# Patient Record
Sex: Female | Born: 1995 | Race: Black or African American | Hispanic: No | Marital: Single | Smoking: Never smoker
Health system: Southern US, Community
[De-identification: ages and names within clinical notes are randomized; demographics above are authoritative.]

## PROBLEM LIST (undated history)

## (undated) DIAGNOSIS — R519 Headache, unspecified: Secondary | ICD-10-CM

## (undated) DIAGNOSIS — F419 Anxiety disorder, unspecified: Secondary | ICD-10-CM

## (undated) DIAGNOSIS — D649 Anemia, unspecified: Secondary | ICD-10-CM

## (undated) DIAGNOSIS — G935 Compression of brain: Secondary | ICD-10-CM

## (undated) HISTORY — PX: BRAIN SURGERY: SHX531

## (undated) HISTORY — PX: OTHER SURGICAL HISTORY: SHX169

---
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
## 2014-08-24 ENCOUNTER — Emergency Department (HOSPITAL_COMMUNITY)
Admission: EM | Admit: 2014-08-24 | Discharge: 2014-08-24 | Disposition: A | Discharge status: Home / Self Care | Payer: BLUE CROSS/BLUE SHIELD | Source: Home / Self Care | Attending: Family Medicine | Admitting: Family Medicine

## 2014-08-24 ENCOUNTER — Encounter (HOSPITAL_COMMUNITY): Payer: Self-pay | Admitting: Emergency Medicine

## 2014-08-24 DIAGNOSIS — R21 Rash and other nonspecific skin eruption: Secondary | ICD-10-CM

## 2014-08-24 LAB — MONONUCLEOSIS SCREEN: MONO SCREEN: NEGATIVE

## 2014-08-24 MED ORDER — PREDNISONE 5 MG (21) PO TBPK: 5.0000 mg | ORAL_TABLET | Freq: Every day | ORAL | Status: DC

## 2014-08-24 NOTE — ED Provider Notes (Signed)
Shelly Leblanc is a 19 y.o. female who presents to Urgent Care today for rash. Patient notes a significant rash on trunk and extremities. This has been present for 2 weeks and is worsening. No new medications, food, cosmetics, detergents etc. She was seen by PCP who gave hydrocortisone cream which did not help. She notes that before the rash started she had some sore throat and was diagnosed with tonsillitis. She feels well otherwise. The rash is not itchy. She denies any fevers chills bodyaches nausea vomiting or diarrhea. No mouth involvement.   History reviewed. No pertinent past medical history. History reviewed. No pertinent past surgical history. History  Substance Use Topics  . Smoking status: Never Smoker   . Smokeless tobacco: Never Used  . Alcohol Use: No   ROS as above Medications: No current facility-administered medications for this encounter.   Current Outpatient Prescriptions  Medication Sig Dispense Refill  . acetaminophen (TYLENOL) 325 MG tablet Take 650 mg by mouth every 6 (six) hours as needed for moderate pain.    . predniSONE (STERAPRED UNI-PAK 21 TAB) 5 MG (21) TBPK tablet Take 1 tablet (5 mg total) by mouth daily. 6 day prednisone dosepack po 21 tablet 0   No Known Allergies   Exam:  BP 138/84 mmHg  Pulse 86  Temp(Src) 98 F (36.7 C) (Oral)  SpO2 99%  LMP 08/03/2014 (Approximate) Gen: Well NAD nontoxic HEENT: EOMI,  MMM no oral lesions Lungs: Normal work of breathing. CTABL Heart: RRR no MRG Abd: NABS, Soft. Nondistended, Nontender Exts: Brisk capillary refill, warm and well perfused.  Skin: Multiple scattered flesh-colored papules on trunk and extremities. 2 numerous to count.  No results found for this or any previous visit (from the past 24 hour(s)). No results found.  Assessment and Plan: 19 y.o. female with rash. Unclear etiology. Likely viral exanthem. Possibly due to EBV. EBV titer pending. Treatment with 6 day Dosepak of prednisone. Referral  to dermatology if not better.  Discussed warning signs or symptoms. Please see discharge instructions. Patient expresses understanding.     Rodolph Bong, MD 08/24/14 [phone]

## 2014-08-24 NOTE — Discharge Instructions (Signed)
Thank you for coming in today. Call or go to the emergency room if you get worse, have trouble breathing, have chest pains, or palpitations.  Follow up with Dr. Terri Piedra if not better.  Take prednisone.  Return as needed.   Viral Exanthems, Adult Many viral infections of the skin are called viral exanthems. Exanthem is another name for a rash or skin eruption. The most common viral exanthems include the following:  [place] measles or rubella.  Measles or rubeola.  Roseola.  Parvovirus B19 (Erythema infectiosum or Fifth disease).  Chickenpox or varicella. DIAGNOSIS  Sometimes, other problems may cause a rash that looks like a viral exanthem. Most often, your caregiver can determine whether you have a viral exanthem by looking at the rash. They usually have distinct patterns or appearance. Lab work may be done if the diagnosis is uncertain. Sometimes, a small tissue sample (biopsy) of the rash may need to be taken. TREATMENT  Immunizations have led to a decrease in the number of cases of measles, mumps, and rubella. Viral exanthems may require clinical treatment if a bacterial infection or other problems follow. The rash may be associated with:  Minor sore throat.  Aches and pains.  Runny nose.  Watery eyes.  Tiredness.  Some coughs.  Gastrointestinal infections causing nausea, vomiting, and diarrhea. Viral exanthems do not respond to antibiotic medicines, because they are not caused by bacteria. HOME CARE INSTRUCTIONS   Only take over-the-counter or prescription medicines for pain, discomfort, diarrhea, or fever as directed by your caregiver.  Drink enough water and fluids to keep your urine clear or pale yellow. SEEK MEDICAL CARE IF:  You develop swollen neck glands. This may feel like lumps or bumps in the neck.  You develop tenderness over your sinuses.  You are not feeling partly better after 3 days.  You develop muscle aches.  You are feeling more tired than you  would expect.  You get a persistent cough with mucus. SEEK IMMEDIATE MEDICAL CARE IF:   You have a fever.  You develop red eyes or eye pain.  You develop sores in your mouth and difficulty drinking or eating.  You develop a sore throat with pus and difficulty swallowing.  You develop neck pain or a stiff neck.  You develop a severe headache.  You develop vomiting that will not stop. Document Released: 05/11/2002 Document Revised: 05/13/2011 Document Reviewed: 05/08/2010 [facility] Patient Information 2015 [place], [place]. This information is not intended to replace advice given to you by your health care provider. Make sure you discuss any questions you have with your health care provider.

## 2014-08-24 NOTE — ED Notes (Signed)
Pt started with a rash on her abdomen about two weeks ago.  It has since spread to her entire body.  She states it does not itch and is not painful, but does feel like it is "crawling" at night.  She denies any fever or any other problems.

## 2014-08-30 ENCOUNTER — Encounter (HOSPITAL_COMMUNITY): Payer: Self-pay | Admitting: Emergency Medicine

## 2014-08-30 ENCOUNTER — Emergency Department (HOSPITAL_COMMUNITY)
Admission: EM | Admit: 2014-08-30 | Discharge: 2014-08-30 | Disposition: A | Discharge status: Home / Self Care | Payer: BLUE CROSS/BLUE SHIELD | Source: Home / Self Care | Attending: Family Medicine | Admitting: Family Medicine

## 2014-08-30 DIAGNOSIS — R21 Rash and other nonspecific skin eruption: Secondary | ICD-10-CM

## 2014-08-30 MED ORDER — PREDNISONE 10 MG (48) PO TBPK: ORAL_TABLET | Freq: Every day | ORAL | Status: DC

## 2014-08-30 MED ORDER — TRIAMCINOLONE ACETONIDE 0.1 % EX CREA: 1.0000 "application " | TOPICAL_CREAM | Freq: Two times a day (BID) | CUTANEOUS | Status: DC

## 2014-08-30 NOTE — ED Provider Notes (Signed)
Shelly Leblanc is a 19 y.o. female who presents to Urgent Care today for rash. Patient was seen on the 22nd for a rash that was unclear etiology. She was treated with 65 mg prednisone Dosepak to help temporarily. The rash returned. She denies itching but notes some burning sensation. No fevers chills nausea vomiting or diarrhea. She feels well otherwise. She has an appointment scheduled with a dermatologist next week.   History reviewed. No pertinent past medical history. History reviewed. No pertinent past surgical history. History  Substance Use Topics  . Smoking status: Never Smoker   . Smokeless tobacco: Never Used  . Alcohol Use: No   ROS as above Medications: No current facility-administered medications for this encounter.   Current Outpatient Prescriptions  Medication Sig Dispense Refill  . hydrocortisone cream 1 % Apply 1 application topically 2 (two) times daily.    Marland Kitchen. acetaminophen (TYLENOL) 325 MG tablet Take 650 mg by mouth every 6 (six) hours as needed for moderate pain.    . predniSONE (STERAPRED UNI-PAK 48 TAB) 10 MG (48) TBPK tablet Take by mouth daily. 48 tablet 0  . triamcinolone cream (KENALOG) 0.1 % Apply 1 application topically 2 (two) times daily. 453.6 g 0   No Known Allergies   Exam:  BP 114/77 mmHg  Pulse 83  Temp(Src) 98.5 F (36.9 C) (Oral)  Resp 12  SpO2 100%  LMP 08/03/2014 (Approximate) Gen: Well NAD HEENT: EOMI,  MMM no oral lesions Lungs: Normal work of breathing. CTABL Heart: RRR no MRG Abd: NABS, Soft. Nondistended, Nontender Exts: Brisk capillary refill, warm and well perfused.  Skin: Maculopapular erythematous rash on trunk and extremities.  No results found for this or any previous visit (from the past 24 hour(s)). No results found.  Assessment and Plan: 19 y.o. female with rash. Unclear etiology possibly pityriasis rosea although rash is not completely characteristic of pityriasis. Treatment with longer prednisone Dosepak triamcinolone  and follow-up with dermatology.  Discussed warning signs or symptoms. Please see discharge instructions. Patient expresses understanding.     Rodolph BongEvan S Mahika Vanvoorhis, MD 08/30/14 [phone]

## 2014-08-30 NOTE — ED Notes (Signed)
Rash x 4 weeks

## 2014-08-30 NOTE — Discharge Instructions (Signed)
Thank you for coming in today. I am not sure what the rash is.  Try taking a longer, stronger prednisone dose.  Use the cream twice daily as needed. Follow up with Dr. Terri PiedraLupton.   Pityriasis Rosea Pityriasis rosea is a rash which is probably caused by a virus. It generally starts as a scaly, red patch on the trunk (the area of the body that a t-shirt would cover) but does not appear on sun exposed areas. The rash is usually preceded by an initial larger spot called the "herald patch" a week or more before the rest of the rash appears. Generally within one to two days the rash appears rapidly on the trunk, upper arms, and sometimes the upper legs. The rash usually appears as flat, oval patches of scaly pink color. The rash can also be raised and one is able to feel it with a finger. The rash can also be finely crinkled and may slough off leaving a ring of scale around the spot. Sometimes a mild sore throat is present with the rash. It usually affects children and young adults in the spring and autumn. Women are more frequently affected than men. TREATMENT  Pityriasis rosea is a self-limited condition. This means it goes away within 4 to 8 weeks without treatment. The spots may persist for several months, especially in darker-colored skin after the rash has resolved and healed. Benadryl and steroid creams may be used if itching is a problem. SEEK MEDICAL CARE IF:   Your rash does not go away or persists longer than three months.  You develop fever and joint pain.  You develop severe headache and confusion.  You develop breathing difficulty, vomiting and/or extreme weakness. Document Released: 03/27/2001 Document Revised: 05/13/2011 Document Reviewed: 04/15/2008 [facility] Patient Information 2015 [employer]. This information is not intended to replace advice given to you by your health care provider. Make sure you discuss any questions you have with your health care provider.

## 2016-09-04 ENCOUNTER — Emergency Department (HOSPITAL_COMMUNITY)
Admission: EM | Admit: 2016-09-04 | Discharge: 2016-09-05 | Disposition: A | Discharge status: Home / Self Care | Payer: 59 | Attending: Emergency Medicine | Admitting: Emergency Medicine

## 2016-09-04 ENCOUNTER — Encounter (HOSPITAL_COMMUNITY): Payer: Self-pay | Admitting: Emergency Medicine

## 2016-09-04 DIAGNOSIS — Y929 Unspecified place or not applicable: Secondary | ICD-10-CM | POA: Diagnosis not present

## 2016-09-04 DIAGNOSIS — S0083XA Contusion of other part of head, initial encounter: Secondary | ICD-10-CM | POA: Diagnosis not present

## 2016-09-04 DIAGNOSIS — Y999 Unspecified external cause status: Secondary | ICD-10-CM | POA: Diagnosis not present

## 2016-09-04 DIAGNOSIS — Y9389 Activity, other specified: Secondary | ICD-10-CM | POA: Diagnosis not present

## 2016-09-04 DIAGNOSIS — H1132 Conjunctival hemorrhage, left eye: Secondary | ICD-10-CM | POA: Diagnosis not present

## 2016-09-04 DIAGNOSIS — Z79899 Other long term (current) drug therapy: Secondary | ICD-10-CM | POA: Diagnosis not present

## 2016-09-04 DIAGNOSIS — H5712 Ocular pain, left eye: Secondary | ICD-10-CM | POA: Diagnosis present

## 2016-09-04 DIAGNOSIS — T148XXA Other injury of unspecified body region, initial encounter: Secondary | ICD-10-CM

## 2016-09-04 MED ORDER — TETRACAINE HCL 0.5 % OP SOLN
2.0000 [drp] | Freq: Once | OPHTHALMIC | Status: AC
Start: 2016-09-04 — End: 2016-09-04
  Administered 2016-09-04: 2 [drp] via OPHTHALMIC
  Filled 2016-09-04: qty 4

## 2016-09-04 MED ORDER — FLUORESCEIN SODIUM 0.6 MG OP STRP
1.0000 | ORAL_STRIP | Freq: Once | OPHTHALMIC | Status: AC
Start: 2016-09-04 — End: 2016-09-04
  Administered 2016-09-04: 1 via OPHTHALMIC
  Filled 2016-09-04: qty 1
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
## 2016-09-05 ENCOUNTER — Emergency Department (HOSPITAL_COMMUNITY): Payer: 59

## 2016-09-05 MED ORDER — IBUPROFEN 800 MG PO TABS: 800.0000 mg | ORAL_TABLET | Freq: Three times a day (TID) | ORAL | 0 refills | Status: DC

## 2016-09-05 NOTE — ED Notes (Signed)
Patient transported to CT 

## 2017-09-16 ENCOUNTER — Encounter (HOSPITAL_COMMUNITY): Payer: Self-pay

## 2017-09-16 ENCOUNTER — Emergency Department (HOSPITAL_COMMUNITY)
Admission: EM | Admit: 2017-09-16 | Discharge: 2017-09-16 | Disposition: A | Discharge status: Home / Self Care | Payer: 59 | Attending: Emergency Medicine | Admitting: Emergency Medicine

## 2017-09-16 DIAGNOSIS — J36 Peritonsillar abscess: Secondary | ICD-10-CM | POA: Diagnosis not present

## 2017-09-16 DIAGNOSIS — J029 Acute pharyngitis, unspecified: Secondary | ICD-10-CM | POA: Diagnosis present

## 2017-09-16 MED ORDER — PREDNISONE 10 MG PO TABS: 20.0000 mg | ORAL_TABLET | Freq: Two times a day (BID) | ORAL | 0 refills | Status: DC

## 2017-09-16 MED ORDER — DEXAMETHASONE SODIUM PHOSPHATE 10 MG/ML IJ SOLN
10.0000 mg | Freq: Once | INTRAMUSCULAR | Status: AC
  Administered 2017-09-16: 10 mg via INTRAVENOUS
  Filled 2017-09-16: qty 1

## 2017-09-16 MED ORDER — CLINDAMYCIN HCL 300 MG PO CAPS: 300.0000 mg | ORAL_CAPSULE | Freq: Four times a day (QID) | ORAL | 0 refills | Status: DC

## 2017-09-16 MED ORDER — CLINDAMYCIN PHOSPHATE 600 MG/50ML IV SOLN
600.0000 mg | Freq: Once | INTRAVENOUS | Status: AC
  Administered 2017-09-16: 600 mg via INTRAVENOUS
  Filled 2017-09-16: qty 50

## 2017-09-16 NOTE — Discharge Instructions (Addendum)
Clindamycin and prednisone as prescribed.  Follow-up with ENT in the next 1 to 2 days.  The contact information for Dr. Jearld FentonByers has been provided in this discharge summary for you to call and make these arrangements.

## 2017-09-16 NOTE — ED Triage Notes (Signed)
Pt complains of a sore throat, she was seen at her primary doctor today and diagnosed with tonsillitis, she received antibiotics and now states that she's having trouble breathing

## 2017-09-16 NOTE — ED Provider Notes (Signed)
[facility]-EMERGENCY DEPT Provider Note   CSN: [id number] Arrival date & time: 09/16/17  0007     History   Chief Complaint Chief Complaint  Patient presents with  . Sore Throat    HPI Kyle L Melven SartoriusCaesar is a 22 y.o. female.  Patient is a 22 year old female presenting with sore throat.  She was seen by her primary doctor for this and diagnosed with tonsillitis.  She comes in tonight with complaints of worsening pain and swelling and difficulty breathing and swallowing.  The history is provided by the patient.  Sore Throat  This is a new problem. The current episode started 2 days ago. The problem occurs constantly. The problem has been rapidly worsening. The symptoms are aggravated by swallowing. Nothing relieves the symptoms. Treatments tried: Amoxicillin. The treatment provided no relief.    History reviewed. No pertinent past medical history.  There are no active problems to display for this patient.   History reviewed. No pertinent surgical history.   OB History   None      Home Medications    Prior to Admission medications   Medication Sig Start Date End Date Taking? Authorizing Provider  penicillin v potassium (VEETID) 500 MG tablet Take 500 mg by mouth 2 (two) times daily.   Yes [provider]  ibuprofen (ADVIL,MOTRIN) 800 MG tablet Take 1 tablet (800 mg total) by mouth 3 (three) times daily. Patient not taking: Reported on 09/16/2017 09/05/16   Gilda CreasePollina, Christopher J, MD  predniSONE (STERAPRED UNI-PAK 48 TAB) 10 MG (48) TBPK tablet Take by mouth daily. Patient not taking: Reported on 09/16/2017 08/30/14   Rodolph Bongorey, Evan S, MD  triamcinolone cream (KENALOG) 0.1 % Apply 1 application topically 2 (two) times daily. Patient not taking: Reported on 09/16/2017 08/30/14   Rodolph Bongorey, Evan S, MD    Family History History reviewed. No pertinent family history.  Social History Social History   Tobacco Use  . Smoking status: Never Smoker  .  Smokeless tobacco: Never Used  Substance Use Topics  . Alcohol use: Yes    Comment: socially   . Drug use: No     Allergies   Patient has no known allergies.   Review of Systems Review of Systems  All other systems reviewed and are negative.    Physical Exam Updated Vital Signs BP 140/82 (BP Location: Left Arm)   Pulse 96   Temp 99.7 F (37.6 C) (Oral)   Resp 18   SpO2 100%   Physical Exam  Constitutional: She is oriented to person, place, and time. She appears well-developed and well-nourished. No distress.  HENT:  Head: Normocephalic and atraumatic.  Mouth/Throat: No uvula swelling. Oropharyngeal exudate and tonsillar abscesses present.  There are exudates to the right tonsil.  There is right tonsillar enlargement with slight deviation of the tonsillar pillar.  Neck: Normal range of motion. Neck supple.  Cardiovascular: Normal rate and regular rhythm. Exam reveals no gallop and no friction rub.  No murmur heard. Pulmonary/Chest: Effort normal and breath sounds normal. No respiratory distress. She has no wheezes.  Abdominal: Soft. Bowel sounds are normal. She exhibits no distension. There is no tenderness.  Musculoskeletal: Normal range of motion.  Neurological: She is alert and oriented to person, place, and time.  Skin: Skin is warm and dry. She is not diaphoretic.  Nursing note and vitals reviewed.    ED Treatments / Results  Labs (all labs ordered are listed, but only abnormal results are displayed) Labs Reviewed -  No data to display  EKG None  Radiology No results found.  Procedures Procedures (including critical care time)  Medications Ordered in ED Medications  clindamycin (CLEOCIN) IVPB 600 mg (600 mg Intravenous New Bag/Given 09/16/17 0421)  dexamethasone (DECADRON) injection 10 mg (10 mg Intravenous Given 09/16/17 0420)     Initial Impression / Assessment and Plan / ED Course  I have reviewed the triage vital signs and the nursing  notes.  Pertinent labs & imaging results that were available during my care of the patient were reviewed by me and considered in my medical decision making (see chart for details).  Patient given Decadron and clindamycin.  She will be discharged with prednisone and clindamycin and follow-up with ENT.  Final Clinical Impressions(s) / ED Diagnoses   Final diagnoses:  None    ED Discharge Orders    None       Geoffery Lyons, MD 09/16/17 [id number]

## 2017-09-16 NOTE — ED Notes (Signed)
Bed: [id number] Expected date:  Expected time:  Means of arrival:  Comments: Held for 7

## 2017-09-16 NOTE — ED Notes (Signed)
Bed: WTR6 Expected date:  Expected time:  Means of arrival:  Comments: 

## 2017-09-20 ENCOUNTER — Other Ambulatory Visit: Payer: Self-pay

## 2017-09-20 ENCOUNTER — Emergency Department (HOSPITAL_COMMUNITY)
Admission: EM | Admit: 2017-09-20 | Discharge: 2017-09-20 | Disposition: A | Discharge status: Home / Self Care | Payer: 59 | Attending: Emergency Medicine | Admitting: Emergency Medicine

## 2017-09-20 ENCOUNTER — Encounter (HOSPITAL_COMMUNITY): Payer: Self-pay | Admitting: Emergency Medicine

## 2017-09-20 DIAGNOSIS — J36 Peritonsillar abscess: Secondary | ICD-10-CM

## 2017-09-20 DIAGNOSIS — R1110 Vomiting, unspecified: Secondary | ICD-10-CM | POA: Diagnosis not present

## 2017-09-20 MED ORDER — ONDANSETRON 4 MG PO TBDP: ORAL_TABLET | ORAL | 0 refills | Status: DC

## 2017-09-20 NOTE — Discharge Instructions (Addendum)
Follow up with the ent doctor as he instructed.   Drink plenty of fluids

## 2017-09-20 NOTE — ED Notes (Signed)
Patient verbalized understanding of discharge instructions, no questions. Patient ambulated out of ED with steady gait in no distress.  

## 2017-09-20 NOTE — ED Triage Notes (Signed)
N/V after tonsil abscess rupture denies problems swallowing or breathing

## 2017-09-20 NOTE — ED Provider Notes (Signed)
[facility]-EMERGENCY DEPT Provider Note   CSN: [id number] Arrival date & time: 09/20/17  4098     History   Chief Complaint Chief Complaint  Patient presents with  . Abscess    paratonsellar abscess    HPI Shelly Leblanc is a 22 y.o. female.  Patient states that she is being treated with penicillin and Cleocin for peritonsillar abscess.  She felt like something was draining her throat and she threw up some blood.  But patient feels better now  The history is provided by the patient. No language interpreter was used.  Sore Throat  This is a recurrent problem. The current episode started more than 2 days ago. The problem occurs constantly. The problem has been resolved. Pertinent negatives include no chest pain, no abdominal pain and no headaches. Nothing aggravates the symptoms. Nothing relieves the symptoms. Treatments tried: Antibiotics. The treatment provided moderate relief.    History reviewed. No pertinent past medical history.  There are no active problems to display for this patient.   History reviewed. No pertinent surgical history.   OB History   None      Home Medications    Prior to Admission medications   Medication Sig Start Date End Date Taking? Authorizing Provider  clindamycin (CLEOCIN) 300 MG capsule Take 1 capsule (300 mg total) by mouth 4 (four) times daily. X 7 days 09/16/17   Geoffery Lyons, MD  ibuprofen (ADVIL,MOTRIN) 800 MG tablet Take 1 tablet (800 mg total) by mouth 3 (three) times daily. Patient not taking: Reported on 09/16/2017 09/05/16   Gilda Crease, MD  ondansetron (ZOFRAN ODT) 4 MG disintegrating tablet 4mg  ODT q4 hours prn nausea/vomit 09/20/17   Bethann Berkshire, MD  penicillin v potassium (VEETID) 500 MG tablet Take 500 mg by mouth 2 (two) times daily.    [provider]  predniSONE (DELTASONE) 10 MG tablet Take 2 tablets (20 mg total) by mouth 2 (two) times daily with a meal. 09/16/17   Geoffery Lyons,  MD  triamcinolone cream (KENALOG) 0.1 % Apply 1 application topically 2 (two) times daily. Patient not taking: Reported on 09/16/2017 08/30/14   Rodolph Bong, MD    Family History History reviewed. No pertinent family history.  Social History Social History   Tobacco Use  . Smoking status: Never Smoker  . Smokeless tobacco: Never Used  Substance Use Topics  . Alcohol use: Yes    Comment: socially   . Drug use: No     Allergies   Patient has no known allergies.   Review of Systems Review of Systems  Constitutional: Negative for appetite change and fatigue.  HENT: Negative for congestion, ear discharge and sinus pressure.        Sore throat  Eyes: Negative for discharge.  Respiratory: Negative for cough.   Cardiovascular: Negative for chest pain.  Gastrointestinal: Negative for abdominal pain and diarrhea.  Genitourinary: Negative for frequency and hematuria.  Musculoskeletal: Negative for back pain.  Skin: Negative for rash.  Neurological: Negative for seizures and headaches.  Psychiatric/Behavioral: Negative for hallucinations.     Physical Exam Updated Vital Signs BP 133/86 (BP Location: Left Arm)   Pulse 76   Temp 98.5 F (36.9 C) (Oral)   Resp 20   Ht 5\' 3"  (1.6 m)   Wt 70.3 kg (155 lb)   LMP 08/07/2017   SpO2 100%   BMI 27.46 kg/m   Physical Exam  Constitutional: She is oriented to person, place, and time.  She appears well-developed.  HENT:  Head: Normocephalic.  Patient has a healing peritonsillar abscess on the right side.  Small dried blood noticed.  Eyes: Conjunctivae are normal.  Neck: No tracheal deviation present.  Cardiovascular: Regular rhythm.  No murmur heard. Musculoskeletal: Normal range of motion.  Neurological: She is oriented to person, place, and time.  Skin: Skin is warm.  Psychiatric: She has a normal mood and affect.     ED Treatments / Results  Labs (all labs ordered are listed, but only abnormal results are  displayed) Labs Reviewed - No data to display  EKG None  Radiology No results found.  Procedures Procedures (including critical care time)  Medications Ordered in ED Medications - No data to display   Initial Impression / Assessment and Plan / ED Course  I have reviewed the triage vital signs and the nursing notes.  Pertinent labs & imaging results that were available during my care of the patient were reviewed by me and considered in my medical decision making (see chart for details).   Patient with healing peritonsillar abscess.  Suspect that it has drained on its own.  She will follow-up with ENT as planned continue with her antibiotics and steroids as prescribed by the ENT doctor   Final Clinical Impressions(s) / ED Diagnoses   Final diagnoses:  Peritonsillar abscess    ED Discharge Orders        Ordered    ondansetron (ZOFRAN ODT) 4 MG disintegrating tablet     09/20/17 0825       Bethann BerkshireZammit, Celine Dishman, MD 09/20/17 0830

## 2018-03-24 ENCOUNTER — Encounter: Payer: Self-pay | Admitting: Internal Medicine

## 2018-03-24 ENCOUNTER — Ambulatory Visit (INDEPENDENT_AMBULATORY_CARE_PROVIDER_SITE_OTHER): Payer: 59 | Admitting: Internal Medicine

## 2018-03-24 VITALS — BP 112/70 | HR 86 | Temp 99.1°F | Ht 65.5 in | Wt 180.0 lb

## 2018-03-24 DIAGNOSIS — R5383 Other fatigue: Secondary | ICD-10-CM | POA: Diagnosis not present

## 2018-03-24 DIAGNOSIS — R05 Cough: Secondary | ICD-10-CM | POA: Diagnosis not present

## 2018-03-24 DIAGNOSIS — R059 Cough, unspecified: Secondary | ICD-10-CM | POA: Insufficient documentation

## 2018-03-24 DIAGNOSIS — Z8349 Family history of other endocrine, nutritional and metabolic diseases: Secondary | ICD-10-CM

## 2018-03-24 DIAGNOSIS — R635 Abnormal weight gain: Secondary | ICD-10-CM | POA: Diagnosis not present

## 2018-03-24 DIAGNOSIS — J039 Acute tonsillitis, unspecified: Secondary | ICD-10-CM | POA: Insufficient documentation

## 2018-03-24 MED ORDER — FEXOFENADINE HCL 180 MG PO TABS: 180.0000 mg | ORAL_TABLET | Freq: Every day | ORAL | 1 refills | Status: DC

## 2018-03-24 NOTE — Progress Notes (Addendum)
Subjective:     Patient ID: Shelly Leblanc , female    DOB: 03-Feb-1996 , 23 y.o.   MRN: [id number]   Chief Complaint  Patient presents with  . Cough  . ringing in left ear  . Obesity  Pt is new pt  HPI Pt is here to establish care with our practice. Onset of cough x 3 years when she stopped pain med and antibiotic for wisdom teeth extraction. It comes for no reason and has never been evaluated for this. Helped with with drinking water, and has to keep water by her bed. Has post nasal drainage and has to clear her throat off and on. Gets wheezing only with colds. Cough is also provoked with being out in the cold or heat, but not with physical activity.  Has not tried allergy meds. Gets spells of cough attacks and gets so bad that she gets light headed. Denies being out of the country prior to the onset of cough. Denies night sweats or wt loss. Admits itchy throat but rare sneezing. Denies GERD. She has not lived in the same house since the cough started, and has continued in her new place.    History reviewed. No pertinent past medical history.   Family History  Problem Relation Age of Onset  . Healthy Mother   . Hypertension Father   . Diabetes Father     No current outpatient medications on file.   No Known Allergies   Review of Systems  Constitutional: Positive for fatigue and unexpected weight change. Negative for chills, diaphoresis and fever.       Mild fatigue. Wt gain since she stopped her birth control. Has gained 60 lb in the past 3 years and has not changed her diet even when going to the gym  Respiratory: Positive for cough. Negative for chest tightness, shortness of breath and wheezing.   Cardiovascular: Negative for chest pain, palpitations and leg swelling.  Gastrointestinal: Positive for constipation.  Endocrine: Negative for cold intolerance and heat intolerance.  Musculoskeletal: Negative for arthralgias and joint swelling.  Skin:       Denies dry skin, or  hair falling out.   Allergic/Immunologic: Negative for environmental allergies and food allergies.  Neurological: Positive for dizziness and headaches.       Gets occipital HA's after coughing, and occasionally gets it on her forehead.   Hematological: Negative for adenopathy.    Today's Vitals   03/24/18 1011  BP: 112/70  Pulse: 86  Temp: 99.1 F (37.3 C)  TempSrc: Oral  SpO2: 98%  Weight: 180 lb (81.6 kg)  Height: 5' 5.5" (1.664 m)   Body mass index is 29.5 kg/m.   Objective:  Physical Exam Vitals signs and nursing note reviewed.  Constitutional:      General: She is not in acute distress.    Appearance: Normal appearance. She is not toxic-appearing.     Comments: She did not cough at all while in the office.   HENT:     Head: Normocephalic.     Right Ear: Tympanic membrane, ear canal and external ear normal.     Left Ear: Tympanic membrane, ear canal and external ear normal.     Nose: Nose normal. No congestion or rhinorrhea.     Mouth/Throat:     Mouth: Mucous membranes are moist.     Pharynx: Oropharynx is clear.  Eyes:     General: No scleral icterus.    Conjunctiva/sclera: Conjunctivae normal.  Neck:  Musculoskeletal: Neck supple. No neck rigidity.  Cardiovascular:     Rate and Rhythm: Normal rate and regular rhythm.     Heart sounds: No murmur.  Pulmonary:     Breath sounds: Normal breath sounds. No wheezing, rhonchi or rales.  Abdominal:     General: Bowel sounds are normal.     Palpations: There is no mass.     Tenderness: There is no abdominal tenderness. There is no guarding or rebound.  Musculoskeletal: Normal range of motion.  Lymphadenopathy:     Cervical: No cervical adenopathy.  Skin:    General: Skin is warm and dry.  Neurological:     Mental Status: She is alert and oriented to person, place, and time.     Gait: Gait normal.  Psychiatric:        Mood and Affect: Mood normal.        Behavior: Behavior normal.        Thought Content:  Thought content normal.        Judgment: Judgment normal.     Assessment And Plan:    1. Other fatigue- mild.  - CBC no Diff - TSH - T3, free - T4, Free - Thyroid antibodies - Thyroid Peroxidase Antibody  2. Family history of hypothyroidism - TSH - T3, free - T4, Free - Thyroid antibodies - Thyroid Peroxidase Antibody  3. Weight gain- unknown cause - TSH - T3, free - T4, Free - Thyroid antibodies - Thyroid Peroxidase Antibody  4. Cough- chronic, could be due to allergies - DG Chest 2 View; Future  I will have her try Allegra 180 mg qd and Fu with me for 1 month. I will inform her via Mychart about her CXR results.   Jerae Izard RODRIGUEZ-SOUTHWORTH, PA-C

## 2018-03-25 LAB — THYROID ANTIBODIES
Thyroglobulin Antibody: <1 IU/mL (ref 0.0–0.9)
Thyroperoxidase Ab SerPl-aCnc: 7 IU/mL (ref 0–34)

## 2018-03-25 LAB — CBC
HEMOGLOBIN: 12.9 g/dL (ref 11.1–15.9)
Hematocrit: 38.2 % (ref 34.0–46.6)
MCH: 29.6 pg (ref 26.6–33.0)
MCHC: 33.8 g/dL (ref 31.5–35.7)
MCV: 88 fL (ref 79–97)
Platelets: 351 10*3/uL (ref 150–450)
RBC: 4.36 x10E6/uL (ref 3.77–5.28)
RDW: 12.1 % (ref 11.7–15.4)
WBC: 4.7 10*3/uL (ref 3.4–10.8)

## 2018-03-25 LAB — T4, FREE: Free T4: 1.19 ng/dL (ref 0.82–1.77)

## 2018-03-25 LAB — T3, FREE: T3, Free: 3 pg/mL (ref 2.0–4.4)

## 2018-03-25 LAB — TSH: TSH: 1.31 u[IU]/mL (ref 0.450–4.500)

## 2018-04-21 ENCOUNTER — Ambulatory Visit: Payer: 59 | Admitting: Internal Medicine

## 2018-05-01 ENCOUNTER — Ambulatory Visit
Admission: RE | Admit: 2018-05-01 | Discharge: 2018-05-01 | Disposition: A | Discharge status: Home / Self Care | Payer: 59 | Source: Ambulatory Visit | Attending: Internal Medicine | Admitting: Internal Medicine

## 2018-05-01 DIAGNOSIS — R059 Cough, unspecified: Secondary | ICD-10-CM

## 2018-05-01 DIAGNOSIS — R05 Cough: Secondary | ICD-10-CM

## 2018-05-05 ENCOUNTER — Ambulatory Visit: Payer: 59 | Admitting: Internal Medicine

## 2018-10-13 ENCOUNTER — Other Ambulatory Visit (HOSPITAL_COMMUNITY)
Admission: RE | Admit: 2018-10-13 | Discharge: 2018-10-13 | Disposition: A | Discharge status: Home / Self Care | Payer: 59 | Source: Ambulatory Visit | Attending: Nurse Practitioner | Admitting: Nurse Practitioner

## 2018-10-13 ENCOUNTER — Ambulatory Visit (INDEPENDENT_AMBULATORY_CARE_PROVIDER_SITE_OTHER): Payer: 59 | Admitting: Nurse Practitioner

## 2018-10-13 ENCOUNTER — Encounter: Payer: Self-pay | Admitting: Nurse Practitioner

## 2018-10-13 ENCOUNTER — Other Ambulatory Visit: Payer: Self-pay

## 2018-10-13 VITALS — BP 124/80 | HR 80 | Temp 98.1°F | Ht 63.0 in | Wt 168.4 lb

## 2018-10-13 DIAGNOSIS — R109 Unspecified abdominal pain: Secondary | ICD-10-CM

## 2018-10-13 DIAGNOSIS — R350 Frequency of micturition: Secondary | ICD-10-CM

## 2018-10-13 DIAGNOSIS — Z3202 Encounter for pregnancy test, result negative: Secondary | ICD-10-CM | POA: Diagnosis not present

## 2018-10-13 DIAGNOSIS — R4586 Emotional lability: Secondary | ICD-10-CM

## 2018-10-13 LAB — POCT URINALYSIS DIPSTICK
Bilirubin, UA: NEGATIVE
Blood, UA: NEGATIVE
Glucose, UA: NEGATIVE
Ketones, UA: NEGATIVE
Leukocytes, UA: NEGATIVE
Nitrite, UA: NEGATIVE
Protein, UA: NEGATIVE
Spec Grav, UA: 1.025 (ref 1.010–1.025)
Urobilinogen, UA: 0.2 E.U./dL
pH, UA: 6 (ref 5.0–8.0)

## 2018-10-13 MED ORDER — AMOXICILLIN 875 MG PO TABS: 875.0000 mg | ORAL_TABLET | Freq: Two times a day (BID) | ORAL | 0 refills | Status: DC

## 2018-10-13 NOTE — Progress Notes (Signed)
  Subjective:     Patient ID: Shelly Leblanc , female    DOB: 11-25-1995 , 23 y.o.   MRN: [id number]   Chief Complaint  Patient presents with  . Abdominal Pain    patient stated her stomach has been feeling weird for the past week and she has had some discomfort urinating.    HPI  Patient's last menstrual period was 07/03/2018.  Has had irregular cycles.  She has taken an oral contraception 3 years ago.    She is eating more and feeling more hungry.  No birth control. Sexually active.    Reporting mood swings high and low energy.  Not causing any issues within the household.  Last time she felt that way she was on birth control.   Abdominal Pain This is a new problem. The current episode started in the past 7 days. The onset quality is sudden. The problem occurs constantly. The problem has been unchanged. The pain is located in the suprapubic region. Quality: discomfort. Associated symptoms include frequency. Pertinent negatives include no anorexia, constipation, diarrhea, fever, headaches or vomiting. Nothing aggravates the pain. The pain is relieved by nothing. She has tried nothing for the symptoms.     No past medical history on file.   Family History  Problem Relation Age of Onset  . Healthy Mother   . Hypertension Father   . Diabetes Father   . Hypothyroidism Maternal Grandmother     No current outpatient medications on file.   No Known Allergies   Review of Systems  Constitutional: Negative for fever.  Gastrointestinal: Positive for abdominal pain. Negative for anorexia, constipation, diarrhea and vomiting.  Genitourinary: Positive for frequency.  Neurological: Negative for dizziness and headaches.     There were no vitals filed for this visit. There is no height or weight on file to calculate BMI.   Objective:  Physical Exam Constitutional:      Appearance: She is well-developed.  Abdominal:     General: Bowel sounds are normal.     Palpations: Abdomen is  soft.     Tenderness: There is no abdominal tenderness.  Neurological:     General: No focal deficit present.     Mental Status: She is alert.  Psychiatric:        Mood and Affect: Mood normal. Mood is not depressed.        Behavior: Behavior normal.         Assessment And Plan:     1. Abdominal pain, unspecified abdominal location  Generalized abdominal tenderness, negative rebound tenderness  Urinalysis checked and normal  Will send for STD evaluation - POCT Urinalysis Dipstick (35361) - POCT Urine Pregnancy - Urine cytology ancillary only - Beta HCG, Quant ([facility])  2. Urinary frequency  Urine culture sent for further evaluation since having abdominal symptoms  Urine pregnancy is negative  Will treat with empiric antibiotic while awaiting culture - Hemoglobin A1c - Culture, Urine  3. Mood changes  Will check metabolic causes, she does not feel depressed however short tempered at times.  - Hemoglobin A1c - TSH - T4 - T3, free - Thyroid antibodies   [name], FNP    THE PATIENT IS ENCOURAGED TO PRACTICE SOCIAL DISTANCING DUE TO THE COVID-19 PANDEMIC.

## 2018-10-14 LAB — TSH: TSH: 1.72 u[IU]/mL (ref 0.450–4.500)

## 2018-10-14 LAB — URINE CULTURE

## 2018-10-14 LAB — THYROID ANTIBODIES
Thyroglobulin Antibody: <1 IU/mL (ref 0.0–0.9)
Thyroperoxidase Ab SerPl-aCnc: <9 IU/mL (ref 0–34)

## 2018-10-14 LAB — T4: T4, Total: 6.9 ug/dL (ref 4.5–12.0)

## 2018-10-14 LAB — HEMOGLOBIN A1C
Est. average glucose Bld gHb Est-mCnc: 111 mg/dL
Hgb A1c MFr Bld: 5.5 % (ref 4.8–5.6)

## 2018-10-14 LAB — BETA HCG QUANT (REF LAB): hCG Quant: <1 m[IU]/mL

## 2018-10-14 LAB — T3, FREE: T3, Free: 3.7 pg/mL (ref 2.0–4.4)

## 2018-10-15 LAB — URINE CYTOLOGY ANCILLARY ONLY
Chlamydia: NEGATIVE
Neisseria Gonorrhea: NEGATIVE
Trichomonas: NEGATIVE

## 2018-10-16 LAB — URINE CYTOLOGY ANCILLARY ONLY
Bacterial vaginitis: NEGATIVE
Candida vaginitis: NEGATIVE

## 2018-10-28 LAB — POCT URINE PREGNANCY: Preg Test, Ur: NEGATIVE

## 2019-02-24 ENCOUNTER — Other Ambulatory Visit: Payer: BLUE CROSS/BLUE SHIELD

## 2019-08-29 ENCOUNTER — Emergency Department (HOSPITAL_BASED_OUTPATIENT_CLINIC_OR_DEPARTMENT_OTHER): Payer: 59

## 2019-08-29 ENCOUNTER — Encounter (HOSPITAL_BASED_OUTPATIENT_CLINIC_OR_DEPARTMENT_OTHER): Payer: Self-pay | Admitting: Emergency Medicine

## 2019-08-29 ENCOUNTER — Emergency Department (HOSPITAL_BASED_OUTPATIENT_CLINIC_OR_DEPARTMENT_OTHER)
Admission: EM | Admit: 2019-08-29 | Discharge: 2019-08-30 | Disposition: A | Discharge status: Home / Self Care | Payer: 59 | Attending: Emergency Medicine | Admitting: Emergency Medicine

## 2019-08-29 DIAGNOSIS — M545 Low back pain, unspecified: Secondary | ICD-10-CM

## 2019-08-29 DIAGNOSIS — Y93I9 Activity, other involving external motion: Secondary | ICD-10-CM | POA: Diagnosis not present

## 2019-08-29 DIAGNOSIS — Y92410 Unspecified street and highway as the place of occurrence of the external cause: Secondary | ICD-10-CM | POA: Insufficient documentation

## 2019-08-29 DIAGNOSIS — R55 Syncope and collapse: Secondary | ICD-10-CM | POA: Insufficient documentation

## 2019-08-29 DIAGNOSIS — S069X9A Unspecified intracranial injury with loss of consciousness of unspecified duration, initial encounter: Secondary | ICD-10-CM | POA: Insufficient documentation

## 2019-08-29 DIAGNOSIS — Y999 Unspecified external cause status: Secondary | ICD-10-CM | POA: Insufficient documentation

## 2019-08-29 DIAGNOSIS — Z3202 Encounter for pregnancy test, result negative: Secondary | ICD-10-CM | POA: Diagnosis not present

## 2019-08-29 DIAGNOSIS — M79652 Pain in left thigh: Secondary | ICD-10-CM | POA: Insufficient documentation

## 2019-08-29 LAB — CBC WITH DIFFERENTIAL/PLATELET
Abs Immature Granulocytes: 0.02 10*3/uL (ref 0.00–0.07)
Basophils Absolute: 0.1 10*3/uL (ref 0.0–0.1)
Basophils Relative: 1 %
Eosinophils Absolute: 0 10*3/uL (ref 0.0–0.5)
Eosinophils Relative: 0 %
HCT: 38.9 % (ref 36.0–46.0)
Hemoglobin: 12.6 g/dL (ref 12.0–15.0)
Immature Granulocytes: 0 %
Lymphocytes Relative: 32 %
Lymphs Abs: 2.7 10*3/uL (ref 0.7–4.0)
MCH: 29.7 pg (ref 26.0–34.0)
MCHC: 32.4 g/dL (ref 30.0–36.0)
MCV: 91.7 fL (ref 80.0–100.0)
Monocytes Absolute: 0.7 10*3/uL (ref 0.1–1.0)
Monocytes Relative: 9 %
Neutro Abs: 4.8 10*3/uL (ref 1.7–7.7)
Neutrophils Relative %: 58 %
Platelets: 304 10*3/uL (ref 150–400)
RBC: 4.24 MIL/uL (ref 3.87–5.11)
RDW: 12.5 % (ref 11.5–15.5)
WBC: 8.4 10*3/uL (ref 4.0–10.5)
nRBC: 0 % (ref 0.0–0.2)

## 2019-08-29 LAB — BASIC METABOLIC PANEL
Anion gap: 9 (ref 5–15)
BUN: 16 mg/dL (ref 6–20)
CO2: 24 mmol/L (ref 22–32)
Calcium: 8.6 mg/dL — ABNORMAL LOW (ref 8.9–10.3)
Chloride: 102 mmol/L (ref 98–111)
Creatinine, Ser: 0.86 mg/dL (ref 0.44–1.00)
GFR calc Af Amer: >60 mL/min (ref >60)
GFR calc non Af Amer: >60 mL/min (ref >60)
Glucose, Bld: 101 mg/dL — ABNORMAL HIGH (ref 70–99)
Potassium: 4.2 mmol/L (ref 3.5–5.1)
Sodium: 135 mmol/L (ref 135–145)

## 2019-08-29 LAB — PREGNANCY, URINE: Preg Test, Ur: NEGATIVE

## 2019-08-29 LAB — URINALYSIS, ROUTINE W REFLEX MICROSCOPIC
Bilirubin Urine: NEGATIVE
Glucose, UA: NEGATIVE mg/dL
Hgb urine dipstick: NEGATIVE
Ketones, ur: NEGATIVE mg/dL
Nitrite: NEGATIVE
Protein, ur: NEGATIVE mg/dL
Specific Gravity, Urine: >1.03 — ABNORMAL HIGH (ref 1.005–1.030)
pH: 6 (ref 5.0–8.0)

## 2019-08-29 LAB — URINALYSIS, MICROSCOPIC (REFLEX)

## 2019-08-29 MED ORDER — NAPROXEN 500 MG PO TABS
500.0000 mg | ORAL_TABLET | Freq: Two times a day (BID) | ORAL | 0 refills | Status: DC
Start: 2019-08-29 — End: 2019-12-03

## 2019-08-29 NOTE — Discharge Instructions (Addendum)
As discussed, all of your labs were reassuring today. Your head CT and all images were normal. Please follow-up with your PCP within the next week for further evaluation. Return to the ER for new or worsening symptoms. Do not drive or operate any machinery until evaluated by your PCP.
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
## 2019-08-29 NOTE — ED Notes (Signed)
Spoke with lab to add on urine culture 
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
## 2019-08-30 NOTE — ED Notes (Addendum)
Dr. Wilkie Aye in with pt. No crutches needed per Dr. Wilkie Aye. Pt ambulated to d/c per her request. No difficulty noted with ambulation.

## 2019-08-30 NOTE — ED Notes (Addendum)
In to d/c pt home.  Pt. Request crutches. States painful to bear weight. MD aware.  Ice pack given.

## 2019-08-31 LAB — URINE CULTURE

## 2019-09-01 ENCOUNTER — Encounter: Payer: Self-pay | Admitting: Nurse Practitioner

## 2019-09-01 ENCOUNTER — Other Ambulatory Visit: Payer: Self-pay

## 2019-09-01 ENCOUNTER — Ambulatory Visit (INDEPENDENT_AMBULATORY_CARE_PROVIDER_SITE_OTHER): Payer: 59 | Admitting: Nurse Practitioner

## 2019-09-01 VITALS — BP 128/84 | HR 94 | Temp 98.3°F | Ht 63.0 in | Wt 193.8 lb

## 2019-09-01 DIAGNOSIS — R55 Syncope and collapse: Secondary | ICD-10-CM | POA: Diagnosis not present

## 2019-09-01 DIAGNOSIS — D72829 Elevated white blood cell count, unspecified: Secondary | ICD-10-CM | POA: Diagnosis not present

## 2019-09-01 LAB — POCT URINALYSIS DIPSTICK
Bilirubin, UA: NEGATIVE
Blood, UA: NEGATIVE
Glucose, UA: NEGATIVE
Ketones, UA: NEGATIVE
Leukocytes, UA: NEGATIVE
Nitrite, UA: NEGATIVE
Protein, UA: NEGATIVE
Spec Grav, UA: 1.02 (ref 1.010–1.025)
Urobilinogen, UA: 0.2 E.U./dL
pH, UA: 7 (ref 5.0–8.0)

## 2019-09-01 LAB — POCT UA - MICROALBUMIN
Albumin/Creatinine Ratio, Urine, POC: 30
Creatinine, POC: 200 mg/dL
Microalbumin Ur, POC: 10 mg/L
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
## 2019-09-02 LAB — URINE CULTURE

## 2019-09-15 ENCOUNTER — Encounter: Payer: Self-pay | Admitting: Diagnostic Neuroimaging

## 2019-09-15 ENCOUNTER — Ambulatory Visit (INDEPENDENT_AMBULATORY_CARE_PROVIDER_SITE_OTHER): Payer: 59 | Admitting: Diagnostic Neuroimaging

## 2019-09-15 ENCOUNTER — Telehealth: Payer: Self-pay | Admitting: Diagnostic Neuroimaging

## 2019-09-15 VITALS — BP 125/82 | HR 90 | Ht 63.0 in | Wt 190.0 lb

## 2019-09-15 DIAGNOSIS — R55 Syncope and collapse: Secondary | ICD-10-CM | POA: Diagnosis not present

## 2019-09-15 NOTE — Telephone Encounter (Signed)
[employer] order sent to [facility]. They will obtain the auth and reach out to the patient to schedule.  
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
## 2019-09-21 ENCOUNTER — Telehealth: Payer: Self-pay

## 2019-09-21 ENCOUNTER — Encounter: Payer: Self-pay | Admitting: Nurse Practitioner
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
## 2019-09-25 ENCOUNTER — Other Ambulatory Visit: Payer: Self-pay

## 2019-09-25 ENCOUNTER — Other Ambulatory Visit: Payer: Self-pay | Admitting: Diagnostic Neuroimaging

## 2019-09-25 ENCOUNTER — Ambulatory Visit
Admission: RE | Admit: 2019-09-25 | Discharge: 2019-09-25 | Disposition: A | Discharge status: Home / Self Care | Payer: BLUE CROSS/BLUE SHIELD | Source: Ambulatory Visit | Attending: Diagnostic Neuroimaging | Admitting: Diagnostic Neuroimaging

## 2019-09-25 DIAGNOSIS — R55 Syncope and collapse: Secondary | ICD-10-CM

## 2019-09-26 ENCOUNTER — Encounter: Payer: Self-pay | Admitting: Nurse Practitioner

## 2019-09-27 NOTE — Telephone Encounter (Signed)
Please call her to schedule an appt for referral

## 2019-09-27 NOTE — Telephone Encounter (Signed)
Drucie Opitz: [id number] (exp. 09/16/19 to 03/15/19) patient had MRI at GI on 09/25/19.

## 2019-10-07 ENCOUNTER — Ambulatory Visit: Payer: 59 | Admitting: Nurse Practitioner

## 2019-10-11 ENCOUNTER — Telehealth: Payer: Self-pay | Admitting: *Deleted

## 2019-10-11 DIAGNOSIS — R55 Syncope and collapse: Secondary | ICD-10-CM | POA: Insufficient documentation

## 2019-10-11 DIAGNOSIS — Q0700 Arnold-Chiari syndrome without spina bifida or hydrocephalus: Secondary | ICD-10-CM | POA: Insufficient documentation

## 2019-10-11 NOTE — Addendum Note (Signed)
Addended by: Levert Feinstein on: 10/11/2019 11:23 AM   Modules accepted: Orders

## 2019-10-11 NOTE — Telephone Encounter (Signed)
Spoke with patient and informed her the MRI brain showed Chiari malformation. Dr Marjory Lies recommends she consider neurosurgery consult for her syncope and chiari malformation. Advised she continue the current plan. She would like referral to neurosurgery,  verbalized understanding, appreciation.

## 2019-10-11 NOTE — Telephone Encounter (Signed)
Orders Placed This Encounter  Procedures   Ambulatory referral to Neurosurgery      

## 2019-10-19 ENCOUNTER — Ambulatory Visit: Payer: 59 | Admitting: Nurse Practitioner

## 2019-11-01 ENCOUNTER — Ambulatory Visit: Payer: 59 | Admitting: Cardiology

## 2019-11-01 ENCOUNTER — Other Ambulatory Visit: Payer: Self-pay | Admitting: Neurosurgery

## 2019-11-01 DIAGNOSIS — G935 Compression of brain: Secondary | ICD-10-CM

## 2019-12-03 ENCOUNTER — Encounter: Payer: Self-pay | Admitting: Internal Medicine

## 2019-12-03 ENCOUNTER — Other Ambulatory Visit: Payer: Self-pay

## 2019-12-03 ENCOUNTER — Ambulatory Visit (INDEPENDENT_AMBULATORY_CARE_PROVIDER_SITE_OTHER): Payer: 59 | Admitting: Internal Medicine

## 2019-12-03 VITALS — BP 114/80 | HR 93 | Ht 63.0 in | Wt 189.8 lb

## 2019-12-03 DIAGNOSIS — Q0700 Arnold-Chiari syndrome without spina bifida or hydrocephalus: Secondary | ICD-10-CM

## 2019-12-03 DIAGNOSIS — Z01810 Encounter for preprocedural cardiovascular examination: Secondary | ICD-10-CM

## 2019-12-03 DIAGNOSIS — R55 Syncope and collapse: Secondary | ICD-10-CM | POA: Diagnosis not present

## 2019-12-03 NOTE — Patient Instructions (Signed)
Medication Instructions:  Your physician recommends that you continue on your current medications as directed. Please refer to the Current Medication list given to you today.  *If you need a refill on your cardiac medications before your next appointment, please call your pharmacy*   Lab Work: none If you have labs (blood work) drawn today and your tests are completely normal, you will receive your results only by: Marland Kitchen MyChart Message (if you have MyChart) OR . A paper copy in the mail If you have any lab test that is abnormal or we need to change your treatment, we will call you to review the results.   Testing/Procedures: Your physician has recommended that you wear a 14 day Zio Patch. A Zio Patch is a medical devices that record the heart's electrical activity. Doctors most often use these monitors to diagnose arrhythmias. Arrhythmias are problems with the speed or rhythm of the heartbeat. The monitor is a small, portable device. You can wear one while you do your normal daily activities. This is usually used to diagnose what is causing palpitations/syncope (passing out).  Your physician has requested that you have an echocardiogram. Echocardiography is a painless test that uses sound waves to create images of your heart. It provides your doctor with information about the size and shape of your heart and how well your heart's chambers and valves are working. This procedure takes approximately one hour. There are no restrictions for this procedure.     Follow-Up: At [facility], you and your health needs are our priority.  As part of our continuing mission to provide you with exceptional heart care, we have created designated Provider Care Teams.  These Care Teams include your primary Cardiologist (physician) and Advanced Practice Providers (APPs -  Physician Assistants and Nurse Practitioners) who all work together to provide you with the care you need, when you need it.  We recommend  signing up for the patient portal called "MyChart".  Sign up information is provided on this After Visit Summary.  MyChart is used to connect with patients for Virtual Visits (Telemedicine).  Patients are able to view lab/test results, encounter notes, upcoming appointments, etc.  Non-urgent messages can be sent to your provider as well.   To learn more about what you can do with MyChart, go to [email or web address].    Your next appointment:   1 year(s)  The format for your next appointment:   In Person  Provider:   You may see Dr. Izora Ribas or one of the following Advanced Practice Providers on your designated Care Team:    Ronie Spies, PA-C  Jacolyn Reedy, PA-C    Other Instructions
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
## 2019-12-07 ENCOUNTER — Ambulatory Visit (INDEPENDENT_AMBULATORY_CARE_PROVIDER_SITE_OTHER): Payer: 59

## 2019-12-07 DIAGNOSIS — R55 Syncope and collapse: Secondary | ICD-10-CM

## 2019-12-07 DIAGNOSIS — Q0700 Arnold-Chiari syndrome without spina bifida or hydrocephalus: Secondary | ICD-10-CM | POA: Diagnosis not present

## 2019-12-22 ENCOUNTER — Other Ambulatory Visit: Payer: Self-pay

## 2019-12-22 ENCOUNTER — Ambulatory Visit (HOSPITAL_COMMUNITY): Payer: 59 | Attending: Cardiology

## 2019-12-22 DIAGNOSIS — Q0700 Arnold-Chiari syndrome without spina bifida or hydrocephalus: Secondary | ICD-10-CM

## 2019-12-22 DIAGNOSIS — R55 Syncope and collapse: Secondary | ICD-10-CM | POA: Diagnosis present

## 2019-12-22 LAB — ECHOCARDIOGRAM COMPLETE
Area-P 1/2: 3.53 cm2
S' Lateral: 2.6 cm

## 2019-12-24 ENCOUNTER — Telehealth: Payer: Self-pay

## 2019-12-24 DIAGNOSIS — Q0700 Arnold-Chiari syndrome without spina bifida or hydrocephalus: Secondary | ICD-10-CM

## 2019-12-24 DIAGNOSIS — R55 Syncope and collapse: Secondary | ICD-10-CM

## 2019-12-24 NOTE — Telephone Encounter (Signed)
Patient reviewed results via MyChart. Orders placed for repeat bubble study to be done in 5-6 months.

## 2019-12-24 NOTE — Telephone Encounter (Signed)
-----   Message from Christell Constant, MD sent at 12/23/2019 11:47 AM EDT ----- Results: Low risk for ASD; bowing of IAS will get limited bubble study in 5-6 months and will see patient shortly after  Christell Constant, MD

## 2020-02-01 ENCOUNTER — Other Ambulatory Visit: Payer: Self-pay

## 2020-02-01 ENCOUNTER — Encounter: Payer: Self-pay | Admitting: Nurse Practitioner

## 2020-02-01 ENCOUNTER — Encounter: Payer: 59 | Admitting: Nurse Practitioner

## 2020-02-01 ENCOUNTER — Ambulatory Visit (INDEPENDENT_AMBULATORY_CARE_PROVIDER_SITE_OTHER): Payer: 59 | Admitting: Nurse Practitioner

## 2020-02-01 VITALS — BP 120/70 | HR 99 | Temp 98.4°F | Ht 63.0 in | Wt 190.2 lb

## 2020-02-01 DIAGNOSIS — Z23 Encounter for immunization: Secondary | ICD-10-CM | POA: Diagnosis not present

## 2020-02-01 DIAGNOSIS — Z2821 Immunization not carried out because of patient refusal: Secondary | ICD-10-CM

## 2020-02-01 DIAGNOSIS — E6609 Other obesity due to excess calories: Secondary | ICD-10-CM | POA: Diagnosis not present

## 2020-02-01 DIAGNOSIS — Z6833 Body mass index (BMI) 33.0-33.9, adult: Secondary | ICD-10-CM

## 2020-02-01 DIAGNOSIS — Z0000 Encounter for general adult medical examination without abnormal findings: Secondary | ICD-10-CM
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
## 2020-02-01 NOTE — Patient Instructions (Signed)
Health Maintenance, Female Adopting a healthy lifestyle and getting preventive care are important in promoting health and wellness. Ask your health care provider about:  The right schedule for you to have regular tests and exams.  Things you can do on your own to prevent diseases and keep yourself healthy. What should I know about diet, weight, and exercise? Eat a healthy diet   Eat a diet that includes plenty of vegetables, fruits, low-fat dairy products, and lean protein.  Do not eat a lot of foods that are high in solid fats, added sugars, or sodium. Maintain a healthy weight Body mass index (BMI) is used to identify weight problems. It estimates body fat based on height and weight. Your health care provider can help determine your BMI and help you achieve or maintain a healthy weight. Get regular exercise Get regular exercise. This is one of the most important things you can do for your health. Most adults should:  Exercise for at least 150 minutes each week. The exercise should increase your heart rate and make you sweat (moderate-intensity exercise).  Do strengthening exercises at least twice a week. This is in addition to the moderate-intensity exercise.  Spend less time sitting. Even light physical activity can be beneficial. Watch cholesterol and blood lipids Have your blood tested for lipids and cholesterol at 25 years of age, then have this test every 5 years. Have your cholesterol levels checked more often if:  Your lipid or cholesterol levels are high.  You are older than 25 years of age.  You are at high risk for heart disease. What should I know about cancer screening? Depending on your health history and family history, you may need to have cancer screening at various ages. This may include screening for:  Breast cancer.  Cervical cancer.  Colorectal cancer.  Skin cancer.  Lung cancer. What should I know about heart disease, diabetes, and high blood  pressure? Blood pressure and heart disease  High blood pressure causes heart disease and increases the risk of stroke. This is more likely to develop in people who have high blood pressure readings, are of African descent, or are overweight.  Have your blood pressure checked: ? Every 3-5 years if you are 18-39 years of age. ? Every year if you are 40 years old or older. Diabetes Have regular diabetes screenings. This checks your fasting blood sugar level. Have the screening done:  Once every three years after age 40 if you are at a normal weight and have a low risk for diabetes.  More often and at a younger age if you are overweight or have a high risk for diabetes. What should I know about preventing infection? Hepatitis B If you have a higher risk for hepatitis B, you should be screened for this virus. Talk with your health care provider to find out if you are at risk for hepatitis B infection. Hepatitis C Testing is recommended for:  Everyone born from 1945 through 1965.  Anyone with known risk factors for hepatitis C. Sexually transmitted infections (STIs)  Get screened for STIs, including gonorrhea and chlamydia, if: ? You are sexually active and are younger than 24 years of age. ? You are older than 24 years of age and your health care provider tells you that you are at risk for this type of infection. ? Your sexual activity has changed since you were last screened, and you are at increased risk for chlamydia or gonorrhea. Ask your health care provider if   you are at risk.  Ask your health care provider about whether you are at high risk for HIV. Your health care provider may recommend a prescription medicine to help prevent HIV infection. If you choose to take medicine to prevent HIV, you should first get tested for HIV. You should then be tested every 3 months for as long as you are taking the medicine. Pregnancy  If you are about to stop having your period (premenopausal) and  you may become pregnant, seek counseling before you get pregnant.  Take 400 to 800 micrograms (mcg) of folic acid every day if you become pregnant.  Ask for birth control (contraception) if you want to prevent pregnancy. Osteoporosis and menopause Osteoporosis is a disease in which the bones lose minerals and strength with aging. This can result in bone fractures. If you are 65 years old or older, or if you are at risk for osteoporosis and fractures, ask your health care provider if you should:  Be screened for bone loss.  Take a calcium or vitamin D supplement to lower your risk of fractures.  Be given hormone replacement therapy (HRT) to treat symptoms of menopause. Follow these instructions at home: Lifestyle  Do not use any products that contain nicotine or tobacco, such as cigarettes, e-cigarettes, and chewing tobacco. If you need help quitting, ask your health care provider.  Do not use street drugs.  Do not share needles.  Ask your health care provider for help if you need support or information about quitting drugs. Alcohol use  Do not drink alcohol if: ? Your health care provider tells you not to drink. ? You are pregnant, may be pregnant, or are planning to become pregnant.  If you drink alcohol: ? Limit how much you use to 0-1 drink a day. ? Limit intake if you are breastfeeding.  Be aware of how much alcohol is in your drink. In the [place] one drink equals one 12 oz bottle of beer (355 mL), one 5 oz glass of wine (148 mL), or one 1 oz glass of hard liquor (44 mL). General instructions  Schedule regular health, dental, and eye exams.  Stay current with your vaccines.  Tell your health care provider if: ? You often feel depressed. ? You have ever been abused or do not feel safe at home. Summary  Adopting a healthy lifestyle and getting preventive care are important in promoting health and wellness.  Follow your health care provider's instructions about healthy  diet, exercising, and getting tested or screened for diseases.  Follow your health care provider's instructions on monitoring your cholesterol and blood pressure. This information is not intended to replace advice given to you by your health care provider. Make sure you discuss any questions you have with your health care provider. Document Revised: 02/11/2018 Document Reviewed: 02/11/2018 Elsevier Patient Education  2020 [employer]vier Inc.  

## 2020-02-02 LAB — CBC
Hematocrit: 40.5 % (ref 34.0–46.6)
Hemoglobin: 13.1 g/dL (ref 11.1–15.9)
MCH: 28.8 pg (ref 26.6–33.0)
MCHC: 32.3 g/dL (ref 31.5–35.7)
MCV: 89 fL (ref 79–97)
Platelets: 338 10*3/uL (ref 150–450)
RBC: 4.55 x10E6/uL (ref 3.77–5.28)
RDW: 12.5 % (ref 11.7–15.4)
WBC: 5.6 10*3/uL (ref 3.4–10.8)

## 2020-02-02 LAB — CMP14+EGFR
ALT: 19 IU/L (ref 0–32)
AST: 21 IU/L (ref 0–40)
Albumin/Globulin Ratio: 1.6 (ref 1.2–2.2)
Albumin: 4.7 g/dL (ref 3.9–5.0)
Alkaline Phosphatase: 85 IU/L (ref 44–121)
BUN/Creatinine Ratio: 20 (ref 9–23)
BUN: 17 mg/dL (ref 6–20)
Bilirubin Total: <0.2 mg/dL (ref 0.0–1.2)
CO2: 21 mmol/L (ref 20–29)
Calcium: 9.6 mg/dL (ref 8.7–10.2)
Chloride: 103 mmol/L (ref 96–106)
Creatinine, Ser: 0.85 mg/dL (ref 0.57–1.00)
GFR calc Af Amer: 110 mL/min/{1.73_m2} (ref >59)
GFR calc non Af Amer: 96 mL/min/{1.73_m2} (ref >59)
Globulin, Total: 3 g/dL (ref 1.5–4.5)
Glucose: 87 mg/dL (ref 65–99)
Potassium: 4.6 mmol/L (ref 3.5–5.2)
Sodium: 139 mmol/L (ref 134–144)
Total Protein: 7.7 g/dL (ref 6.0–8.5)

## 2020-02-02 LAB — LIPID PANEL
Chol/HDL Ratio: 3.2 ratio (ref 0.0–4.4)
Cholesterol, Total: 158 mg/dL (ref 100–199)
HDL: 50 mg/dL (ref >39)
LDL Chol Calc (NIH): 97 mg/dL (ref 0–99)
Triglycerides: 55 mg/dL (ref 0–149)
VLDL Cholesterol Cal: 11 mg/dL (ref 5–40)

## 2020-02-02 LAB — HEMOGLOBIN A1C
Est. average glucose Bld gHb Est-mCnc: 105 mg/dL
Hgb A1c MFr Bld: 5.3 % (ref 4.8–5.6)

## 2020-02-02 LAB — HIV ANTIBODY (ROUTINE TESTING W REFLEX): HIV Screen 4th Generation wRfx: NONREACTIVE

## 2020-02-02 LAB — HEPATITIS C ANTIBODY: Hep C Virus Ab: <0.1 s/co ratio (ref 0.0–0.9)

## 2020-05-17 ENCOUNTER — Other Ambulatory Visit (HOSPITAL_COMMUNITY): Payer: 59

## 2020-05-18 ENCOUNTER — Telehealth (HOSPITAL_COMMUNITY): Payer: Self-pay | Admitting: Internal Medicine

## 2020-05-18 NOTE — Telephone Encounter (Signed)
Patient cancelled echocardiogram. I called patient to reschedule and had to leave a message to call back. Order will be removed from the ECHO WQ and when patient calls back to reschedule we will reinstate the order. Thank you

## 2020-07-17 ENCOUNTER — Other Ambulatory Visit: Payer: Self-pay | Admitting: Neurological Surgery

## 2020-08-07 NOTE — Pre-Procedure Instructions (Signed)
Surgical Instructions    Your procedure is scheduled on Monday June 13th.  Report to [facility] Main Entrance "A" at 08:40 A.M., then check in with the Admitting office.  Call this number if you have problems the morning of surgery:  [phone]   If you have any questions prior to your surgery date call [phone]: Open Monday-Friday 8am-4pm    Remember:  Do not eat or drink after midnight the night before your surgery    Take these medicines the morning of surgery with A SIP OF WATER    None    As of today, STOP taking any Aspirin (unless otherwise instructed by your surgeon) Aleve, Naproxen, Ibuprofen, Motrin, Advil, Goody's, BC's, all herbal medications, fish oil, and all vitamins.          Do not wear jewelry or makeup Do not wear lotions, powders, perfumes/colognes, or deodorant. Do not shave 48 hours prior to surgery.  Do not bring valuables to the hospital. DO Not wear nail polish, gel polish, artificial nails, or any other type of covering on  natural nails including finger and toenails. If patients have artificial nails, gel coating, etc. that need to be removed by a nail salon please have this removed prior to surgery or surgery may need to be canceled/delayed if the surgeon/ anesthesia feels like the patient is unable to be adequately monitored.             [facility] is not responsible for any belongings or valuables.  Do NOT Smoke (Tobacco/Vaping) or drink Alcohol 24 hours prior to your procedure If you use a CPAP at night, you may bring all equipment for your overnight stay.   Contacts, glasses, dentures or bridgework may not be worn into surgery, please bring cases for these belongings   For patients admitted to the hospital, discharge time will be determined by your treatment team.   Patients discharged the day of surgery will not be allowed to drive home, and someone needs to stay with them for 24 hours.    Special instructions:    Oral Hygiene is also  important to reduce your risk of infection.  Remember - BRUSH YOUR TEETH THE MORNING OF SURGERY WITH YOUR REGULAR TOOTHPASTE   [facility]- Preparing For Surgery  Before surgery, you can play an important role. Because skin is not sterile, your skin needs to be as free of germs as possible. You can reduce the number of germs on your skin by washing with CHG (chlorahexidine gluconate) Soap before surgery.  CHG is an antiseptic cleaner which kills germs and bonds with the skin to continue killing germs even after washing.     Please do not use if you have an allergy to CHG or antibacterial soaps. If your skin becomes reddened/irritated stop using the CHG.  Do not shave (including legs and underarms) for at least 48 hours prior to first CHG shower. It is OK to shave your face.  Please follow these instructions carefully.    1.  Shower the NIGHT BEFORE SURGERY and the MORNING OF SURGERY with CHG Soap.   If you chose to wash your hair, wash your hair first as usual with your normal shampoo. After you shampoo, rinse your hair and body thoroughly to remove the shampoo.  Then [employer] and genitals (private parts) with your normal soap and rinse thoroughly to remove soap.  2. After that Use CHG Soap as you would any other liquid soap. You can apply CHG directly  to the skin and wash gently with a scrungie or a clean washcloth.   3. Apply the CHG Soap to your body ONLY FROM THE NECK DOWN.  Do not use on open wounds or open sores. Avoid contact with your eyes, ears, mouth and genitals (private parts). Wash Face and genitals (private parts)  with your normal soap.   4. Wash thoroughly, paying special attention to the area where your surgery will be performed.  5. Thoroughly rinse your body with warm water from the neck down.  6. DO NOT shower/wash with your normal soap after using and rinsing off the CHG Soap.  7. Pat yourself dry with a CLEAN TOWEL.  8. Wear CLEAN PAJAMAS to bed the night before  surgery  9. Place CLEAN SHEETS on your bed the night before your surgery  10. DO NOT SLEEP WITH PETS.   Day of Surgery:  Take a shower with CHG soap. Wear Clean/Comfortable clothing the morning of surgery Do not apply any deodorants/lotions.   Remember to brush your teeth WITH YOUR REGULAR TOOTHPASTE.   Please read over the following fact sheets that you were given.

## 2020-08-08 ENCOUNTER — Other Ambulatory Visit: Payer: Self-pay

## 2020-08-08 ENCOUNTER — Encounter (HOSPITAL_COMMUNITY): Payer: Self-pay

## 2020-08-08 ENCOUNTER — Encounter (HOSPITAL_COMMUNITY)
Admission: RE | Admit: 2020-08-08 | Discharge: 2020-08-08 | Disposition: A | Discharge status: Home / Self Care | Payer: 59 | Source: Ambulatory Visit | Attending: Neurological Surgery | Admitting: Neurological Surgery

## 2020-08-08 DIAGNOSIS — Z01812 Encounter for preprocedural laboratory examination: Secondary | ICD-10-CM | POA: Insufficient documentation

## 2020-08-08 HISTORY — DX: Anemia, unspecified: D64.9

## 2020-08-08 HISTORY — DX: Headache, unspecified: R51.9

## 2020-08-08 HISTORY — DX: Anxiety disorder, unspecified: F41.9

## 2020-08-08 LAB — CBC
HCT: 40.4 % (ref 36.0–46.0)
Hemoglobin: 13 g/dL (ref 12.0–15.0)
MCH: 29.7 pg (ref 26.0–34.0)
MCHC: 32.2 g/dL (ref 30.0–36.0)
MCV: 92.4 fL (ref 80.0–100.0)
Platelets: 291 10*3/uL (ref 150–400)
RBC: 4.37 MIL/uL (ref 3.87–5.11)
RDW: 12.5 % (ref 11.5–15.5)
WBC: 6.2 10*3/uL (ref 4.0–10.5)
nRBC: 0 % (ref 0.0–0.2)

## 2020-08-08 LAB — TYPE AND SCREEN
ABO/RH(D): A POS
Antibody Screen: NEGATIVE

## 2020-08-08 LAB — SURGICAL PCR SCREEN
MRSA, PCR: NEGATIVE
Staphylococcus aureus: NEGATIVE

## 2020-08-08 NOTE — Progress Notes (Signed)
PCP - Arnette Felts, FNP Cardiologist - denies  PPM/ICD - denies   Chest x-ray - n/a EKG - 12/03/19 Stress Test - denies ECHO - 12/22/19 Cardiac Cath - denies  Sleep Study - denies   No diabetes  As of today, STOP taking any Aspirin (unless otherwise instructed by your surgeon) Aleve, Naproxen, Ibuprofen, Motrin, Advil, Goody's, BC's, all herbal medications, fish oil, and all vitamins.  ERAS Protcol -no   COVID TEST- scheduled for 08/11/20   Anesthesia review: no  Patient denies shortness of breath, fever, cough and chest pain at PAT appointment   All instructions explained to the patient, with a verbal understanding of the material. Patient agrees to go over the instructions while at home for a better understanding. Patient also instructed to self quarantine after being tested for COVID-19. The opportunity to ask questions was provided.

## 2020-08-11 ENCOUNTER — Other Ambulatory Visit (HOSPITAL_COMMUNITY)
Admission: RE | Admit: 2020-08-11 | Discharge: 2020-08-11 | Disposition: A | Discharge status: Home / Self Care | Payer: 59 | Source: Ambulatory Visit | Attending: Neurological Surgery | Admitting: Neurological Surgery

## 2020-08-11 DIAGNOSIS — Z01812 Encounter for preprocedural laboratory examination: Secondary | ICD-10-CM | POA: Insufficient documentation

## 2020-08-11 DIAGNOSIS — Z20822 Contact with and (suspected) exposure to covid-19: Secondary | ICD-10-CM | POA: Insufficient documentation

## 2020-08-11 LAB — SARS CORONAVIRUS 2 (TAT 6-24 HRS): SARS Coronavirus 2: NEGATIVE

## 2020-08-13 NOTE — Anesthesia Preprocedure Evaluation (Addendum)
Anesthesia Evaluation  Patient identified by MRN, date of birth, ID band Patient awake    Reviewed: Allergy & Precautions, NPO status , Patient's Chart, lab work & pertinent test results  Airway Mallampati: II  TM Distance: >3 FB Neck ROM: Full    Dental no notable dental hx. (+) Dental Advisory Given, Teeth Intact   Pulmonary neg pulmonary ROS,    Pulmonary exam normal breath sounds clear to auscultation       Cardiovascular negative cardio ROS Normal cardiovascular exam Rhythm:Regular Rate:Normal     Neuro/Psych  Headaches, PSYCHIATRIC DISORDERS Anxiety    GI/Hepatic negative GI ROS, Neg liver ROS,   Endo/Other  negative endocrine ROS  Renal/GU negative Renal ROS     Musculoskeletal negative musculoskeletal ROS (+)   Abdominal (+) + obese,   Peds  Hematology  (+) Blood dyscrasia, anemia ,   Anesthesia Other Findings   Reproductive/Obstetrics                            Anesthesia Physical Anesthesia Plan  ASA: 2  Anesthesia Plan: General   Post-op Pain Management:    Induction: Intravenous  PONV Risk Score and Plan: 4 or greater and Ondansetron, Dexamethasone, Propofol infusion, Aprepitant, Treatment may vary due to age or medical condition, Midazolam and Diphenhydramine  Airway Management Planned: Oral ETT  Additional Equipment: Arterial line  Intra-op Plan:   Post-operative Plan: Possible Post-op intubation/ventilation  Informed Consent: I have reviewed the patients History and Physical, chart, labs and discussed the procedure including the risks, benefits and alternatives for the proposed anesthesia with the patient or authorized representative who has indicated his/her understanding and acceptance.     Dental advisory given  Plan Discussed with: CRNA  Anesthesia Plan Comments: (2 PIV, remi gtt)       Anesthesia Quick Evaluation

## 2020-08-14 ENCOUNTER — Inpatient Hospital Stay (HOSPITAL_COMMUNITY)
Admission: RE | Admit: 2020-08-14 | Discharge: 2020-08-16 | DRG: 027 | Disposition: A | Discharge status: Home / Self Care | Payer: 59 | Attending: Neurological Surgery | Admitting: Neurological Surgery

## 2020-08-14 ENCOUNTER — Encounter (HOSPITAL_COMMUNITY): Payer: Self-pay | Admitting: Neurological Surgery

## 2020-08-14 ENCOUNTER — Encounter (HOSPITAL_COMMUNITY)
Admission: RE | Disposition: A | Discharge status: Home / Self Care | Payer: Self-pay | Source: Home / Self Care | Attending: Neurological Surgery

## 2020-08-14 ENCOUNTER — Inpatient Hospital Stay (HOSPITAL_COMMUNITY): Payer: 59

## 2020-08-14 DIAGNOSIS — G935 Compression of brain: Principal | ICD-10-CM | POA: Diagnosis present

## 2020-08-14 DIAGNOSIS — Z20822 Contact with and (suspected) exposure to covid-19: Secondary | ICD-10-CM | POA: Diagnosis present

## 2020-08-14 HISTORY — PX: SUBOCCIPITAL CRANIECTOMY CERVICAL LAMINECTOMY: SHX5404

## 2020-08-14 LAB — CBC
HCT: 38.4 % (ref 36.0–46.0)
Hemoglobin: 12.9 g/dL (ref 12.0–15.0)
MCH: 29.9 pg (ref 26.0–34.0)
MCHC: 33.6 g/dL (ref 30.0–36.0)
MCV: 89.1 fL (ref 80.0–100.0)
Platelets: 283 10*3/uL (ref 150–400)
RBC: 4.31 MIL/uL (ref 3.87–5.11)
RDW: 12.6 % (ref 11.5–15.5)
WBC: 8.2 10*3/uL (ref 4.0–10.5)
nRBC: 0 % (ref 0.0–0.2)

## 2020-08-14 LAB — POCT PREGNANCY, URINE: Preg Test, Ur: NEGATIVE

## 2020-08-14 LAB — CREATININE, SERUM
Creatinine, Ser: 0.77 mg/dL (ref 0.44–1.00)
GFR, Estimated: >60 mL/min (ref >60)

## 2020-08-14 LAB — ABO/RH: ABO/RH(D): A POS

## 2020-08-14 SURGERY — SUBOCCIPITAL CRANIECTOMY CERVICAL LAMINECTOMY/DURAPLASTY
Anesthesia: General

## 2020-08-14 MED ORDER — AMISULPRIDE (ANTIEMETIC) 5 MG/2ML IV SOLN: 10.0000 mg | Freq: Once | INTRAVENOUS | Status: DC | PRN

## 2020-08-14 MED ORDER — LIDOCAINE HCL (PF) 2 % IJ SOLN
INTRAMUSCULAR | Status: AC
  Filled 2020-08-14: qty 5

## 2020-08-14 MED ORDER — ARTIFICIAL TEARS OPHTHALMIC OINT
TOPICAL_OINTMENT | OPHTHALMIC | Status: DC | PRN
  Administered 2020-08-14: 1 via OPHTHALMIC

## 2020-08-14 MED ORDER — MIDAZOLAM HCL 5 MG/5ML IJ SOLN
INTRAMUSCULAR | Status: DC | PRN
  Administered 2020-08-14: 2 mg via INTRAVENOUS

## 2020-08-14 MED ORDER — PROPOFOL 10 MG/ML IV BOLUS
INTRAVENOUS | Status: DC | PRN
  Administered 2020-08-14: 50 mg via INTRAVENOUS
  Administered 2020-08-14: 200 mg via INTRAVENOUS
  Administered 2020-08-14: 40 mg via INTRAVENOUS
  Administered 2020-08-14: 50 mg via INTRAVENOUS

## 2020-08-14 MED ORDER — HEPARIN SODIUM (PORCINE) 5000 UNIT/ML IJ SOLN
5000.0000 [IU] | Freq: Three times a day (TID) | INTRAMUSCULAR | Status: DC
  Administered 2020-08-16: 5000 [IU] via SUBCUTANEOUS
  Filled 2020-08-14: qty 1

## 2020-08-14 MED ORDER — LACTATED RINGERS IV SOLN: INTRAVENOUS | Status: DC

## 2020-08-14 MED ORDER — DEXAMETHASONE SODIUM PHOSPHATE 10 MG/ML IJ SOLN
INTRAMUSCULAR | Status: DC | PRN
  Administered 2020-08-14: 10 mg via INTRAVENOUS

## 2020-08-14 MED ORDER — MIDAZOLAM HCL 2 MG/2ML IJ SOLN
INTRAMUSCULAR | Status: AC
  Filled 2020-08-14: qty 2

## 2020-08-14 MED ORDER — HYDROMORPHONE HCL 1 MG/ML IJ SOLN
0.5000 mg | INTRAMUSCULAR | Status: DC | PRN
  Administered 2020-08-14 – 2020-08-15 (×4): 0.5 mg via INTRAVENOUS
  Filled 2020-08-14 (×5): qty 1

## 2020-08-14 MED ORDER — SUGAMMADEX SODIUM 200 MG/2ML IV SOLN
INTRAVENOUS | Status: DC | PRN
  Administered 2020-08-14: 200 mg via INTRAVENOUS

## 2020-08-14 MED ORDER — CHLORHEXIDINE GLUCONATE CLOTH 2 % EX PADS: 6.0000 | MEDICATED_PAD | Freq: Once | CUTANEOUS | Status: DC

## 2020-08-14 MED ORDER — THROMBIN 5000 UNITS EX SOLR
CUTANEOUS | Status: AC
  Filled 2020-08-14: qty 5000

## 2020-08-14 MED ORDER — CHLORHEXIDINE GLUCONATE 0.12 % MT SOLN
15.0000 mL | Freq: Once | OROMUCOSAL | Status: AC
  Administered 2020-08-14: 15 mL via OROMUCOSAL
  Filled 2020-08-14: qty 15

## 2020-08-14 MED ORDER — ALBUMIN HUMAN 5 % IV SOLN: INTRAVENOUS | Status: DC | PRN

## 2020-08-14 MED ORDER — SODIUM CHLORIDE 0.9 % IV SOLN
150.0000 mg | INTRAVENOUS | Status: AC
  Administered 2020-08-14: 150 mg via INTRAVENOUS
  Filled 2020-08-14: qty 5

## 2020-08-14 MED ORDER — SODIUM CHLORIDE 0.9 % IV SOLN: INTRAVENOUS | Status: DC

## 2020-08-14 MED ORDER — PROMETHAZINE HCL 25 MG PO TABS
12.5000 mg | ORAL_TABLET | ORAL | Status: DC | PRN
  Administered 2020-08-14: 25 mg via ORAL
  Filled 2020-08-14: qty 1

## 2020-08-14 MED ORDER — LIDOCAINE-EPINEPHRINE 1 %-1:100000 IJ SOLN
INTRAMUSCULAR | Status: DC | PRN
  Administered 2020-08-14: 4 mL

## 2020-08-14 MED ORDER — SODIUM CHLORIDE 0.9 % IV SOLN
0.0125 ug/kg/min | INTRAVENOUS | Status: AC
  Administered 2020-08-14: .1 ug/kg/min via INTRAVENOUS
  Filled 2020-08-14: qty 2000

## 2020-08-14 MED ORDER — ROCURONIUM BROMIDE 10 MG/ML (PF) SYRINGE
PREFILLED_SYRINGE | INTRAVENOUS | Status: DC | PRN
  Administered 2020-08-14: 60 mg via INTRAVENOUS
  Administered 2020-08-14: 40 mg via INTRAVENOUS
  Administered 2020-08-14: 20 mg via INTRAVENOUS

## 2020-08-14 MED ORDER — PROPOFOL 10 MG/ML IV BOLUS
INTRAVENOUS | Status: AC
  Filled 2020-08-14: qty 40

## 2020-08-14 MED ORDER — BUPIVACAINE HCL (PF) 0.5 % IJ SOLN
INTRAMUSCULAR | Status: DC | PRN
  Administered 2020-08-14: 4 mL

## 2020-08-14 MED ORDER — ONDANSETRON HCL 4 MG/2ML IJ SOLN
INTRAMUSCULAR | Status: DC | PRN
  Administered 2020-08-14: 4 mg via INTRAVENOUS

## 2020-08-14 MED ORDER — ORAL CARE MOUTH RINSE: 15.0000 mL | Freq: Once | OROMUCOSAL | Status: AC

## 2020-08-14 MED ORDER — ROCURONIUM BROMIDE 10 MG/ML (PF) SYRINGE
PREFILLED_SYRINGE | INTRAVENOUS | Status: AC
  Filled 2020-08-14: qty 20

## 2020-08-14 MED ORDER — PHENYLEPHRINE HCL-NACL 10-0.9 MG/250ML-% IV SOLN
INTRAVENOUS | Status: DC | PRN
  Administered 2020-08-14: 30 ug/min via INTRAVENOUS

## 2020-08-14 MED ORDER — MEPERIDINE HCL 25 MG/ML IJ SOLN: 6.2500 mg | INTRAMUSCULAR | Status: DC | PRN

## 2020-08-14 MED ORDER — ONDANSETRON HCL 4 MG/2ML IJ SOLN
4.0000 mg | INTRAMUSCULAR | Status: DC | PRN
  Administered 2020-08-14 – 2020-08-15 (×2): 4 mg via INTRAVENOUS
  Filled 2020-08-14 (×2): qty 2

## 2020-08-14 MED ORDER — ONDANSETRON HCL 4 MG PO TABS: 4.0000 mg | ORAL_TABLET | ORAL | Status: DC | PRN

## 2020-08-14 MED ORDER — HEMOSTATIC AGENTS (NO CHARGE) OPTIME
TOPICAL | Status: DC | PRN
  Administered 2020-08-14: 1 via TOPICAL

## 2020-08-14 MED ORDER — DOCUSATE SODIUM 100 MG PO CAPS
100.0000 mg | ORAL_CAPSULE | Freq: Two times a day (BID) | ORAL | Status: DC
  Administered 2020-08-15 – 2020-08-16 (×3): 100 mg via ORAL
  Filled 2020-08-14 (×4): qty 1

## 2020-08-14 MED ORDER — CEFAZOLIN SODIUM-DEXTROSE 2-4 GM/100ML-% IV SOLN
2.0000 g | INTRAVENOUS | Status: AC
  Administered 2020-08-14: 2 g via INTRAVENOUS
  Filled 2020-08-14: qty 100

## 2020-08-14 MED ORDER — PROPOFOL 500 MG/50ML IV EMUL
INTRAVENOUS | Status: DC | PRN
  Administered 2020-08-14: 20 ug/kg/min via INTRAVENOUS

## 2020-08-14 MED ORDER — POLYETHYLENE GLYCOL 3350 17 G PO PACK
17.0000 g | PACK | Freq: Every day | ORAL | Status: DC | PRN
  Administered 2020-08-16: 17 g via ORAL
  Filled 2020-08-14: qty 1

## 2020-08-14 MED ORDER — HYDROCODONE-ACETAMINOPHEN 5-325 MG PO TABS
1.0000 | ORAL_TABLET | ORAL | Status: DC | PRN
  Administered 2020-08-14 – 2020-08-16 (×7): 1 via ORAL
  Filled 2020-08-14 (×8): qty 1

## 2020-08-14 MED ORDER — CHLORHEXIDINE GLUCONATE CLOTH 2 % EX PADS: 6.0000 | MEDICATED_PAD | Freq: Every day | CUTANEOUS | Status: DC

## 2020-08-14 MED ORDER — ACETAMINOPHEN 650 MG RE SUPP: 650.0000 mg | RECTAL | Status: DC | PRN

## 2020-08-14 MED ORDER — FENTANYL CITRATE (PF) 250 MCG/5ML IJ SOLN
INTRAMUSCULAR | Status: DC | PRN
  Administered 2020-08-14 (×2): 50 ug via INTRAVENOUS

## 2020-08-14 MED ORDER — BACITRACIN ZINC 500 UNIT/GM EX OINT
TOPICAL_OINTMENT | CUTANEOUS | Status: AC
  Filled 2020-08-14: qty 28.35

## 2020-08-14 MED ORDER — DEXAMETHASONE SODIUM PHOSPHATE 10 MG/ML IJ SOLN
INTRAMUSCULAR | Status: AC
  Filled 2020-08-14: qty 1

## 2020-08-14 MED ORDER — BUPIVACAINE HCL (PF) 0.5 % IJ SOLN
INTRAMUSCULAR | Status: AC
  Filled 2020-08-14: qty 30

## 2020-08-14 MED ORDER — THROMBIN 5000 UNITS EX SOLR
OROMUCOSAL | Status: DC | PRN
  Administered 2020-08-14: 5 mL via TOPICAL

## 2020-08-14 MED ORDER — FENTANYL CITRATE (PF) 250 MCG/5ML IJ SOLN
INTRAMUSCULAR | Status: AC
  Filled 2020-08-14: qty 5

## 2020-08-14 MED ORDER — PHENYLEPHRINE 40 MCG/ML (10ML) SYRINGE FOR IV PUSH (FOR BLOOD PRESSURE SUPPORT)
PREFILLED_SYRINGE | INTRAVENOUS | Status: DC | PRN
  Administered 2020-08-14: 80 ug via INTRAVENOUS
  Administered 2020-08-14 (×2): 160 ug via INTRAVENOUS

## 2020-08-14 MED ORDER — HYDROMORPHONE HCL 1 MG/ML IJ SOLN
INTRAMUSCULAR | Status: AC
  Administered 2020-08-14: 0.5 mg via INTRAVENOUS
  Filled 2020-08-14: qty 1

## 2020-08-14 MED ORDER — LIDOCAINE 2% (20 MG/ML) 5 ML SYRINGE
INTRAMUSCULAR | Status: DC | PRN
  Administered 2020-08-14: 100 mg via INTRAVENOUS

## 2020-08-14 MED ORDER — ACETAMINOPHEN 325 MG PO TABS
650.0000 mg | ORAL_TABLET | ORAL | Status: DC | PRN
  Administered 2020-08-14: 650 mg via ORAL
  Filled 2020-08-14: qty 2

## 2020-08-14 MED ORDER — PHENYLEPHRINE 40 MCG/ML (10ML) SYRINGE FOR IV PUSH (FOR BLOOD PRESSURE SUPPORT)
PREFILLED_SYRINGE | INTRAVENOUS | Status: AC
  Filled 2020-08-14: qty 10

## 2020-08-14 MED ORDER — CEFAZOLIN SODIUM-DEXTROSE 2-4 GM/100ML-% IV SOLN
2.0000 g | Freq: Three times a day (TID) | INTRAVENOUS | Status: AC
Start: 2020-08-14 — End: 2020-08-15
  Administered 2020-08-14 – 2020-08-15 (×2): 2 g via INTRAVENOUS
  Filled 2020-08-14 (×2): qty 100

## 2020-08-14 MED ORDER — HYDROMORPHONE HCL 1 MG/ML IJ SOLN
0.2500 mg | INTRAMUSCULAR | Status: DC | PRN
  Administered 2020-08-14: 0.5 mg via INTRAVENOUS

## 2020-08-14 MED ORDER — BACITRACIN ZINC 500 UNIT/GM EX OINT
TOPICAL_OINTMENT | CUTANEOUS | Status: DC | PRN
  Administered 2020-08-14: 1 via TOPICAL

## 2020-08-14 MED ORDER — LIDOCAINE-EPINEPHRINE 1 %-1:100000 IJ SOLN
INTRAMUSCULAR | Status: AC
  Filled 2020-08-14: qty 1

## 2020-08-14 MED ORDER — ONDANSETRON HCL 4 MG/2ML IJ SOLN
INTRAMUSCULAR | Status: AC
  Filled 2020-08-14: qty 2

## 2020-08-14 SURGICAL SUPPLY — 66 items
ADH SKN CLS APL DERMABOND .7 (GAUZE/BANDAGES/DRESSINGS) ×1
APL SKNCLS STERI-STRIP NONHPOA (GAUZE/BANDAGES/DRESSINGS)
BAND INSRT 18 STRL LF DISP RB (MISCELLANEOUS)
BAND RUBBER #18 3X1/16 STRL (MISCELLANEOUS) IMPLANT
BENZOIN TINCTURE PRP APPL 2/3 (GAUZE/BANDAGES/DRESSINGS) IMPLANT
BLADE CLIPPER SURG (BLADE) ×3 IMPLANT
BLADE SURG 11 STRL SS (BLADE) ×3 IMPLANT
BLADE ULTRA TIP 2M (BLADE) IMPLANT
BUR ACORN 6.0 PRECISION (BURR) IMPLANT
BUR ACORN 6.0MM PRECISION (BURR)
BUR ACORN 9.0 PRECISION (BURR) ×2 IMPLANT
BUR ACORN 9.0MM PRECISION (BURR) ×1
BUR PRECISION FLUTE 5.0 (BURR) ×3 IMPLANT
CANISTER SUCT 3000ML PPV (MISCELLANEOUS) ×3 IMPLANT
CLIP VESOCCLUDE MED 6/CT (CLIP) IMPLANT
COVER BACK TABLE 60X90IN (DRAPES) IMPLANT
COVER WAND RF STERILE (DRAPES) ×3 IMPLANT
DERMABOND ADVANCED (GAUZE/BANDAGES/DRESSINGS) ×2
DERMABOND ADVANCED .7 DNX12 (GAUZE/BANDAGES/DRESSINGS) ×1 IMPLANT
DRAPE LAPAROTOMY 100X72 PEDS (DRAPES) ×3 IMPLANT
DRAPE MICROSCOPE LEICA (MISCELLANEOUS) ×3 IMPLANT
DRAPE WARM FLUID 44X44 (DRAPES) ×3 IMPLANT
DURAGUARD 04CMX04CM ×3 IMPLANT
DURAPREP 6ML APPLICATOR 50/CS (WOUND CARE) ×3 IMPLANT
ELECT REM PT RETURN 9FT ADLT (ELECTROSURGICAL) ×3
ELECTRODE REM PT RTRN 9FT ADLT (ELECTROSURGICAL) ×1 IMPLANT
EVACUATOR 1/8 PVC DRAIN (DRAIN) IMPLANT
EVACUATOR SILICONE 100CC (DRAIN) IMPLANT
GAUZE 4X4 16PLY RFD (DISPOSABLE) IMPLANT
GAUZE SPONGE 4X4 12PLY STRL (GAUZE/BANDAGES/DRESSINGS) IMPLANT
GLOVE BIOGEL PI IND STRL 7.5 (GLOVE) ×1 IMPLANT
GLOVE BIOGEL PI INDICATOR 7.5 (GLOVE) ×2
GLOVE ECLIPSE 7.5 STRL STRAW (GLOVE) ×3 IMPLANT
GLOVE SURG UNDER POLY LF SZ7 (GLOVE) ×9 IMPLANT
GLOVE SURG UNDER POLY LF SZ7.5 (GLOVE) ×9 IMPLANT
GOWN STRL REUS W/ TWL LRG LVL3 (GOWN DISPOSABLE) ×1 IMPLANT
GOWN STRL REUS W/ TWL XL LVL3 (GOWN DISPOSABLE) ×1 IMPLANT
GOWN STRL REUS W/TWL 2XL LVL3 (GOWN DISPOSABLE) ×3 IMPLANT
GOWN STRL REUS W/TWL LRG LVL3 (GOWN DISPOSABLE) ×3
GOWN STRL REUS W/TWL XL LVL3 (GOWN DISPOSABLE) ×3
HEMOSTAT POWDER KIT SURGIFOAM (HEMOSTASIS) ×3 IMPLANT
HEMOSTAT SURGICEL 2X14 (HEMOSTASIS) IMPLANT
KIT BASIN OR (CUSTOM PROCEDURE TRAY) ×3 IMPLANT
KIT TURNOVER KIT B (KITS) ×3 IMPLANT
NEEDLE HYPO 25X1 1.5 SAFETY (NEEDLE) ×3 IMPLANT
NS IRRIG 1000ML POUR BTL (IV SOLUTION) ×6 IMPLANT
PACK CRANIOTOMY CUSTOM (CUSTOM PROCEDURE TRAY) ×3 IMPLANT
PAD ARMBOARD 7.5X6 YLW CONV (MISCELLANEOUS) IMPLANT
PATTIES SURGICAL .5 X1 (DISPOSABLE) IMPLANT
PATTIES SURGICAL 1/4 X 3 (GAUZE/BANDAGES/DRESSINGS) IMPLANT
SEALANT ADHERUS EXTEND TIP (MISCELLANEOUS) ×3 IMPLANT
SPONGE LAP 4X18 RFD (DISPOSABLE) IMPLANT
SPONGE SURGIFOAM ABS GEL 100 (HEMOSTASIS) IMPLANT
STAPLER SKIN PROX WIDE 3.9 (STAPLE) ×3 IMPLANT
SUT ETHILON 3 0 FSL (SUTURE) IMPLANT
SUT MNCRL AB 3-0 PS2 27 (SUTURE) ×3 IMPLANT
SUT NURALON 4 0 TR CR/8 (SUTURE) ×3 IMPLANT
SUT PROLENE 6 0 BV (SUTURE) IMPLANT
SUT VIC AB 0 CT1 18XCR BRD8 (SUTURE) ×1 IMPLANT
SUT VIC AB 0 CT1 8-18 (SUTURE) ×3
SUT VIC AB 2-0 CP2 18 (SUTURE) ×3 IMPLANT
TOWEL GREEN STERILE (TOWEL DISPOSABLE) ×3 IMPLANT
TOWEL GREEN STERILE FF (TOWEL DISPOSABLE) ×3 IMPLANT
TRAY FOLEY MTR SLVR 16FR STAT (SET/KITS/TRAYS/PACK) IMPLANT
UNDERPAD 30X36 HEAVY ABSORB (UNDERPADS AND DIAPERS) IMPLANT
WATER STERILE IRR 1000ML POUR (IV SOLUTION) ×3 IMPLANT

## 2020-08-14 NOTE — Anesthesia Procedure Notes (Signed)
Procedure Name: Intubation Date/Time: 08/14/2020 11:19 AM Performed by: Elliot Dally, CRNA Pre-anesthesia Checklist: Patient identified, Emergency Drugs available, Suction available and Patient being monitored Patient Re-evaluated:Patient Re-evaluated prior to induction Oxygen Delivery Method: Circle System Utilized Preoxygenation: Pre-oxygenation with 100% oxygen Induction Type: IV induction Ventilation: Mask ventilation without difficulty Laryngoscope Size: Miller and 3 Grade View: Grade I Tube type: Oral Tube size: 7.0 mm Number of attempts: 1 Airway Equipment and Method: Stylet and Oral airway Placement Confirmation: ETT inserted through vocal cords under direct vision, positive ETCO2 and breath sounds checked- equal and bilateral Secured at: 22 cm Tube secured with: Tape Dental Injury: Teeth and Oropharynx as per pre-operative assessment

## 2020-08-14 NOTE — Op Note (Signed)
PATIENT: Shelly Leblanc  DAY OF SURGERY: 08/14/20   PRE-OPERATIVE DIAGNOSIS:  Chiari malformation   POST-OPERATIVE DIAGNOSIS:  Chiari malformation   PROCEDURE:  Posterior fossa decompression for chiari malformation   SURGEON:  Surgeon(s) and Role:    Jadene Pierini, MD - Primary    Barnett Abu, MD - Assisting   ANESTHESIA: ETGA   BRIEF HISTORY: This is a 25 year old woman who presented with headaches and some syncopal symptoms. The patient was found to have a chiari malformation. I therefore recommended a posterior fossa decompression. This was discussed with the patient as well as risks, benefits, and alternatives and wished to proceed with surgery.   OPERATIVE DETAIL: The patient was taken to the operating room, anesthesia was induced by the anesthesia team. A formal time out was performed with two patient identifiers and confirmed the operative site. The [place] head holder was applied and the patient was placed prone with padding of all pressure points. The operative site was marked, hair was clipped with surgical clippers, the area was then prepped and draped in a sterile fashion.   A linear incision was placed from the inion to the spinous process of C2 in the midline. Soft tissues were dissected, retractors were placed, and a suboccipital craniectomy was performed with a combination of high speed drill and rongeurs along with removal of the superior aspect of the C1 lamina. The thickened suboccipital ligament was identified and divided. Dr. Danielle Dess scrubbed in to assist during the intracranial portion of the procedure to assist with retraction, graft suturing, etc. The dura was opened in a Y shaped fashion and the arachnoid was dissected. The tonsils were full and did not decompress until I lysed the arachnoid inferiorly to them with brisk flow of CSF. Given how full they were, I dissected between the tonsils and elevated to confirm good flow through the obex, which there was.  Hemostasis was confirmed and an expansile duroplasty was performed by sewing in a bovine pericardial graft in a watertight fashion. There was a good seal with valsalva, the suture line was covered with fibrin sealant.   All instrument and sponge counts were correct, hemostasis was confirmed, the incision was copiously irrigated, then then closed in layers. The patient was then returned to anesthesia for emergence. No apparent complications at the completion of the procedure.   EBL:  82mL   DRAINS: none   SPECIMENS: none   Jadene Pierini, MD 08/14/20 11:08 AM

## 2020-08-14 NOTE — Anesthesia Procedure Notes (Signed)
Arterial Line Insertion Start/End6/13/2022 10:00 PM, 08/14/2020 10:15 PM Performed by: Elliot Dally, CRNA, CRNA  Patient location: OR. Preanesthetic checklist: patient identified, IV checked, site marked, risks and benefits discussed, surgical consent, monitors and equipment checked, pre-op evaluation, timeout performed and anesthesia consent Left, radial was placed Catheter size: 20 G Hand hygiene performed  and maximum sterile barriers used  Allen's test indicative of satisfactory collateral circulation Attempts: 1 Procedure performed without using ultrasound guided technique. Following insertion, dressing applied and Biopatch. Post procedure assessment: normal  Patient tolerated the procedure well with no immediate complications.

## 2020-08-14 NOTE — Transfer of Care (Signed)
Immediate Anesthesia Transfer of Care Note  Patient: Shelly Leblanc  Procedure(s) Performed: Posterior fossa decompression for chiari malformation  Patient Location: PACU  Anesthesia Type:General  Level of Consciousness: awake and alert   Airway & Oxygen Therapy: Patient Spontanous Breathing and Patient connected to face mask oxygen  Post-op Assessment: Report given to RN and Post -op Vital signs reviewed and stable  Post vital signs: Reviewed and stable  Last Vitals:  Vitals Value Taken Time  BP 92/64 08/14/20 1347  Temp    Pulse 108 08/14/20 1348  Resp 22 08/14/20 1348  SpO2 100 % 08/14/20 1348  Vitals shown include unvalidated device data.  Last Pain:  Vitals:   08/14/20 0843  TempSrc: Oral         Complications: No notable events documented.
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
## 2020-08-15 ENCOUNTER — Encounter (HOSPITAL_COMMUNITY): Payer: Self-pay | Admitting: Neurological Surgery

## 2020-08-15 MED ORDER — CALCIUM CARBONATE ANTACID 500 MG PO CHEW
1.0000 | CHEWABLE_TABLET | Freq: Three times a day (TID) | ORAL | Status: DC | PRN
  Administered 2020-08-15: 200 mg via ORAL
  Filled 2020-08-15: qty 1

## 2020-08-15 MED ORDER — ORAL CARE MOUTH RINSE: 15.0000 mL | Freq: Two times a day (BID) | OROMUCOSAL | Status: DC

## 2020-08-15 NOTE — Anesthesia Postprocedure Evaluation (Signed)
Anesthesia Post Note  Patient: Psychologist, occupational  Procedure(s) Performed: Posterior fossa decompression for chiari malformation     Patient location during evaluation: PACU Anesthesia Type: General Level of consciousness: sedated and patient cooperative Pain management: pain level controlled Vital Signs Assessment: post-procedure vital signs reviewed and stable Respiratory status: spontaneous breathing Cardiovascular status: stable Anesthetic complications: no   No notable events documented.  Last Vitals:  Vitals:   08/15/20 1255 08/15/20 2010  BP: 131/78 (!) 157/95  Pulse: 88 (!) 104  Resp:    Temp: 36.7 C 37 C  SpO2: 95%     Last Pain:  Vitals:   08/15/20 2141  TempSrc:   PainSc: 6                  Lewie Loron

## 2020-08-15 NOTE — Evaluation (Signed)
Occupational Therapy Evaluation Patient Details Name: Shelly Leblanc MRN: [id number] DOB: September 22, 1995 Today's Date: 08/15/2020    History of Present Illness Pt is a 25 y.o. F who presents with headaches and syncopal episodes. 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 Now s/p posterior fossa decompression for Chiari malformation 08/14/2020. Significant PMH: anxiety.   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 Pt currently, supervisionA for ADL and mobility. Increased time for tasks and able to perform UB/LB ADL with figure 4 technique. Pt standing at sink for grooming tasks. Pt's mother in room and reports that family will be very supportive. Education on energy conservation with periods of rest, wakefulness and decreased screen time.  No further skilled OT services required. Pt reports pain in head/neck from sx site. OT signing off.     Follow Up Recommendations  No OT follow up;Supervision - Intermittent    Equipment Recommendations  None recommended by OT    Recommendations for Other Services       Precautions / Restrictions Precautions Precautions: Fall Restrictions Weight Bearing Restrictions: No      Mobility Bed Mobility Overal bed mobility: Modified Independent                  Transfers Overall transfer level: Needs assistance Equipment used: None Transfers: Sit to/from Stand Sit to Stand: Supervision              Balance Overall balance assessment: Mild deficits observed, not formally tested                                         ADL either performed or assessed with clinical judgement   ADL Overall ADL's : Modified independent                                       General ADL Comments: Increased time for tasks and able to perform UB/LB ADL with figure 4 technique. Pt standing at sink for grooming tasks. Pt's mother in room and reports that family will be very  supportive. pt asking about support dogs that are very energetic and pt will need more time to heal so that they don't knock her over.     Vision Baseline Vision/History: No visual deficits Patient Visual Report: No change from baseline Vision Assessment?: Yes Eye Alignment: Within Functional Limits Ocular Range of Motion: Within Functional Limits Alignment/Gaze Preference: Within Defined Limits Tracking/Visual Pursuits: Able to track stimulus in all quads without difficulty Additional Comments: Pt reading and sending texts via phone with no difficulty.     Perception     Praxis      Pertinent Vitals/Pain Pain Assessment: Faces Faces Pain Scale: Hurts even more Pain Location: neck Pain Descriptors / Indicators: Aching;Constant;Heaviness;Operative site guarding Pain Intervention(s): Monitored during session     Hand Dominance Right   Extremity/Trunk Assessment Upper Extremity Assessment Upper Extremity Assessment: RUE deficits/detail;LUE deficits/detail RUE Deficits / Details: strength fair 4/5; grip strength 3+/5 RUE Coordination: WNL LUE Deficits / Details: strength fair 4/5; grip strength 3+/5 LUE Coordination: WNL   Lower Extremity Assessment Lower Extremity Assessment: Overall WFL for tasks assessed   Cervical / Trunk Assessment Cervical / Trunk Assessment: Normal   Communication Communication Communication: No difficulties   Cognition Arousal/Alertness: Awake/alert Behavior During  Therapy: WFL for tasks assessed/performed Overall Cognitive Status: Within Functional Limits for tasks assessed                                     General Comments  VSS. Pt's mother in room. Education on energy conservation with periods of rest, wakefulness and decreased screen time.    Exercises     Shoulder Instructions      Home Living Family/patient expects to be discharged to:: Private residence Living Arrangements: Alone Available Help at Discharge:  Family;Available 24 hours/day Type of Home: House Home Access: Level entry     Home Layout: Two level Alternate Level Stairs-Number of Steps:  (flight) Alternate Level Stairs-Rails: None Bathroom Shower/Tub: Tub/shower unit         Home Equipment: None          Prior Functioning/Environment Level of Independence: Independent        Comments: working at a school        OT Problem List: Decreased activity tolerance      OT Treatment/Interventions:      OT Goals(Current goals can be found in the care plan section) Acute Rehab OT Goals Patient Stated Goal: for improved balance, less pain OT Goal Formulation: All assessment and education complete, DC therapy Potential to Achieve Goals: Good  OT Frequency:     Barriers to D/C:            Co-evaluation              AM-PAC OT "6 Clicks" Daily Activity     Outcome Measure Help from another person eating meals?: None Help from another person taking care of personal grooming?: None Help from another person toileting, which includes using toliet, bedpan, or urinal?: A Little Help from another person bathing (including washing, rinsing, drying)?: A Little Help from another person to put on and taking off regular upper body clothing?: None Help from another person to put on and taking off regular lower body clothing?: None 6 Click Score: 22   End of Session Nurse Communication: Mobility status  Activity Tolerance: Patient tolerated treatment well Patient left: in bed;with call bell/phone within reach;with family/visitor present  OT Visit Diagnosis: Unsteadiness on feet (R26.81);Pain Pain - part of body:  (head)                Time: 1235-1300 OT Time Calculation (min): 25 min Charges:  OT General Charges $OT Visit: 1 Visit OT Evaluation $OT Eval Low Complexity: 1 Low OT Treatments $Self Care/Home Management : 8-22 mins  Flora Lipps, OTR/L Acute Rehabilitation Services Pager: [phone] Office:  [phone]   Lonzo Cloud 08/15/2020, 5:13 PM

## 2020-08-15 NOTE — Evaluation (Signed)
Physical Therapy Evaluation Patient Details Name: Shelly Leblanc MRN: [id number] DOB: 09/01/1995 Today's Date: 08/15/2020   History of Present Illness  Pt is a 25 y.o. F who presents with headaches and syncopal episodes. 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 Now s/p posterior fossa decompression for Chiari malformation 08/14/2020. Significant PMH: anxiety.  Clinical Impression  Pt presents with neck pain and decreased ROM, mild dizziness, and dynamic balance deficits. Pt agreeable and motivated to participate. Ambulating x 200 feet with handheld assist and negotiated 4 steps with cues for technique. Discussed gaze stabilization, performing cervical ROM in pain free range, and ice vs heat if needed. Suspect pt will progress well. Will follow acutely.     Follow Up Recommendations No PT follow up;Supervision for mobility/OOB    Equipment Recommendations  None recommended by PT    Recommendations for Other Services       Precautions / Restrictions Precautions Precautions: Fall Restrictions Weight Bearing Restrictions: No      Mobility  Bed Mobility Overal bed mobility: Modified Independent                  Transfers Overall transfer level: Needs assistance Equipment used: None Transfers: Sit to/from Stand Sit to Stand: Supervision            Ambulation/Gait Ambulation/Gait assistance: Min guard Gait Distance (Feet): 200 Feet Assistive device: 1 person hand held assist Gait Pattern/deviations: Step-through pattern;Decreased stride length Gait velocity: decreased   General Gait Details: Mild dynamic instability and slower pace, handheld assist for balance, increased right foot pronation which pt states is baseline  Stairs Stairs: Yes Stairs assistance: Min guard Stair Management: One rail Left Number of Stairs: 4 General stair comments: Cues for technique, step by step pattern  Wheelchair Mobility    Modified Rankin (Stroke Patients Only)        Balance Overall balance assessment: Mild deficits observed, not formally tested                                           Pertinent Vitals/Pain Pain Assessment: Faces Faces Pain Scale: Hurts even more Pain Location: neck Pain Descriptors / Indicators: Aching;Constant;Heaviness;Operative site guarding Pain Intervention(s): Monitored during session    Home Living Family/patient expects to be discharged to:: Private residence Living Arrangements: Alone Available Help at Discharge: Family;Available 24 hours/day Type of Home: House Home Access: Level entry     Home Layout: Two level Home Equipment: None      Prior Function Level of Independence: Independent         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        Extremity/Trunk Assessment   Upper Extremity Assessment Upper Extremity Assessment: Defer to OT evaluation    Lower Extremity Assessment Lower Extremity Assessment: Overall WFL for tasks assessed       Communication   Communication: No difficulties  Cognition Arousal/Alertness: Awake/alert Behavior During Therapy: WFL for tasks assessed/performed Overall Cognitive Status: Within Functional Limits for tasks assessed                                        General Comments      Exercises     Assessment/Plan    PT Assessment Patient needs continued PT services  PT Problem List Decreased balance;Decreased mobility;Pain       PT Treatment Interventions Gait training;Stair training;Functional mobility training;Therapeutic activities;Therapeutic exercise;Balance training;Patient/family education    PT Goals (Current goals can be found in the Care Plan section)  Acute Rehab PT Goals Patient Stated Goal: for improved balance, less pain PT Goal Formulation: With patient/family Time For Goal Achievement: 08/29/20 Potential to Achieve Goals: Good    Frequency Min 4X/week   Barriers to discharge         Co-evaluation               AM-PAC PT "6 Clicks" Mobility  Outcome Measure Help needed turning from your back to your side while in a flat bed without using bedrails?: None Help needed moving from lying on your back to sitting on the side of a flat bed without using bedrails?: None Help needed moving to and from a bed to a chair (including a wheelchair)?: A Little Help needed standing up from a chair using your arms (e.g., wheelchair or bedside chair)?: A Little Help needed to walk in hospital room?: A Little Help needed climbing 3-5 steps with a railing? : A Little 6 Click Score: 20    End of Session Equipment Utilized During Treatment: Gait belt Activity Tolerance: Patient tolerated treatment well Patient left: in bed;with call bell/phone within reach;with family/visitor present Nurse Communication: Mobility status PT Visit Diagnosis: Unsteadiness on feet (R26.81);Pain Pain - part of body:  (neck)    Time: 7322-0254 PT Time Calculation (min) (ACUTE ONLY): 25 min   Charges:   PT Evaluation $PT Eval Low Complexity: 1 Low PT Treatments $Gait Training: 8-22 mins        Lillia Pauls, PT, DPT Acute Rehabilitation Services Pager [phone] Office [phone]   Norval Morton 08/15/2020, 1:27 PM

## 2020-08-15 NOTE — Progress Notes (Signed)
Neurosurgery Service Progress Note  Subjective: No acute events overnight, neck pain 7/10 this morning, appetite slowly improving, no N/V this morning   Objective: Vitals:   08/15/20 0300 08/15/20 0400 08/15/20 0500 08/15/20 0600  BP: (!) 109/58 (!) 110/57 120/62 119/70  Pulse: 82 85 81 95  Resp: 14 13 17 16   Temp: 98.5 F (36.9 C)     TempSrc: Oral     SpO2: 97% 98% 99% 97%  Weight:      Height:        Physical Exam: AOx3, PERRL, EOMI, FS, TM, Strength 5/5 x4, SILTx4 Incision c/d/i  Assessment & Plan: 25 y.o. woman s/p chiari decompression, recovering well.  -d/c foley -transfer to floor  22  08/15/20 7:42 AM

## 2020-08-16 MED ORDER — HYDROCODONE-ACETAMINOPHEN 5-325 MG PO TABS: 1.0000 | ORAL_TABLET | ORAL | 0 refills | Status: DC | PRN

## 2020-08-16 NOTE — Progress Notes (Signed)
Ivs removed, D/C instructions reviewed with pt. Will transport pt via WC to be D/C home with family.

## 2020-08-16 NOTE — Progress Notes (Signed)
Neurosurgery Service Progress Note  Subjective: No acute events overnight, neck pain slowly improving, no N/V  Objective: Vitals:   08/15/20 1255 08/15/20 2010 08/15/20 2316 08/16/20 0357  BP: 131/78 (!) 157/95 113/61 128/61  Pulse: 88 (!) 104 86 88  Resp:   20   Temp: 98.1 F (36.7 C) 98.6 F (37 C) 98.4 F (36.9 C) 98.9 F (37.2 C)  TempSrc: Oral     SpO2: 95%  100% 100%  Weight:      Height:        Physical Exam: AOx3, PERRL, EOMI, FS, TM, Strength 5/5 x4, SILTx4 Incision c/d/i  Assessment & Plan: 25 y.o. woman s/p chiari decompression, recovering well.  -will see how she feels when mobilizing today, if she feels up to it I'm okay with her going home later this morning  Jadene Pierini  08/16/20 7:18 AM

## 2020-08-16 NOTE — Progress Notes (Signed)
Physical Therapy Treatment Patient Details Name: Shelly Leblanc MRN: [id number] DOB: [birth year] Today's Date: 08/16/2020    History of Present Illness Pt is a 25 y.o. F who presents with headaches and syncopal episodes. 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 Now s/p posterior fossa decompression for Chiari malformation 08/14/2020. Significant PMH: anxiety.    PT Comments    Pt with noted progression in mobility; demonstrates improved gait speed with cues and dynamic balance. Ambulating x 200 feet with no assistive device and negotiated x 5 steps with no rails to simulate home set up. Reviewed cervical ROM in pain free range, supervision for unlevel surfaces, and activity recommendations. Pt/pt family with no further questions or concerns.     Follow Up Recommendations  No PT follow up;Supervision for mobility/OOB     Equipment Recommendations  None recommended by PT    Recommendations for Other Services       Precautions / Restrictions Precautions Precautions: Fall Restrictions Weight Bearing Restrictions: No    Mobility  Bed Mobility Overal bed mobility: Needs Assistance Bed Mobility: Sidelying to Sit   Sidelying to sit: Min assist       General bed mobility comments: Pt family member providing minA to bring her trunk to upright position    Transfers Overall transfer level: Independent Equipment used: None                Ambulation/Gait Ambulation/Gait assistance: Min Emergency planning/management officer (Feet): 200 Feet Assistive device: None Gait Pattern/deviations: Step-through pattern;Decreased stride length Gait velocity: decreased   General Gait Details: Cues for reciprocal arm swing, increased gait speed, no overt LOB, slower speed for age   42 Stairs: Yes Stairs assistance: Min guard Stair Management: No rails Number of Stairs: 5 General stair comments: Handheld assist for balance, cues for "feeling" for the step   Wheelchair  Mobility    Modified Rankin (Stroke Patients Only)       Balance Overall balance assessment: Mild deficits observed, not formally tested                                          Cognition Arousal/Alertness: Awake/alert Behavior During Therapy: WFL for tasks assessed/performed Overall Cognitive Status: Within Functional Limits for tasks assessed                                        Exercises      General Comments        Pertinent Vitals/Pain Pain Assessment: Faces Faces Pain Scale: Hurts even more Pain Location: neck Pain Descriptors / Indicators: Aching;Constant;Heaviness;Operative site guarding Pain Intervention(s): Monitored during session    Home Living                      Prior Function            PT Goals (current goals can now be found in the care plan section) Acute Rehab PT Goals Patient Stated Goal: for improved balance, less pain PT Goal Formulation: With patient/family Time For Goal Achievement: 08/29/20 Potential to Achieve Goals: Good Progress towards PT goals: Progressing toward goals    Frequency    Min 4X/week      PT Plan Current plan remains appropriate    Co-evaluation  AM-PAC PT "6 Clicks" Mobility   Outcome Measure  Help needed turning from your back to your side while in a flat bed without using bedrails?: None Help needed moving from lying on your back to sitting on the side of a flat bed without using bedrails?: None Help needed moving to and from a bed to a chair (including a wheelchair)?: A Little Help needed standing up from a chair using your arms (e.g., wheelchair or bedside chair)?: None Help needed to walk in hospital room?: A Little Help needed climbing 3-5 steps with a railing? : A Little 6 Click Score: 21    End of Session Equipment Utilized During Treatment: Gait belt Activity Tolerance: Patient tolerated treatment well Patient left: in bed;with  call bell/phone within reach;with family/visitor present Nurse Communication: Mobility status PT Visit Diagnosis: Unsteadiness on feet (R26.81);Pain Pain - part of body:  (neck)     Time: 5364-6803 PT Time Calculation (min) (ACUTE ONLY): 11 min  Charges:  $Therapeutic Activity: 8-22 mins                     Lillia Pauls, PT, DPT Acute Rehabilitation Services Pager [phone] Office [phone]    Norval Morton 08/16/2020, 3:06 PM

## 2020-08-16 NOTE — Discharge Summary (Signed)
Discharge Summary  Date of Admission: 08/14/2020  Date of Discharge: 08/16/20  Attending Physician: Autumn Patty, MD  Hospital Course: Patient was admitted following an uncomplicated posterior fossa decompression for chiari malformation. She was recovered in PACU and transferred to 4N then 4NP. Her hospital course was uncomplicated and the patient was discharged home on 08/16/20. She will follow up in clinic with me in 2 weeks.  Neurologic exam at discharge:  AOx3, PERRL, EOMI, FS, TM Strength 5/5 x4, SILTx4, no drift  Discharge diagnosis: Chiari malformation  Jadene Pierini, MD 08/16/20 11:52 AM

## 2020-08-28 ENCOUNTER — Inpatient Hospital Stay (HOSPITAL_COMMUNITY)
Admission: AD | Disposition: A | Discharge status: Home / Self Care | Payer: Self-pay | Source: Home / Self Care | Attending: Neurological Surgery

## 2020-08-28 ENCOUNTER — Inpatient Hospital Stay (HOSPITAL_COMMUNITY): Payer: 59 | Admitting: Anesthesiology

## 2020-08-28 ENCOUNTER — Encounter (HOSPITAL_COMMUNITY): Payer: Self-pay | Admitting: Neurosurgery

## 2020-08-28 ENCOUNTER — Inpatient Hospital Stay (HOSPITAL_COMMUNITY)
Admission: AD | Admit: 2020-08-28 | Discharge: 2020-08-31 | DRG: 027 | Disposition: A | Discharge status: Home / Self Care | Payer: 59 | Attending: Neurological Surgery | Admitting: Neurological Surgery

## 2020-08-28 DIAGNOSIS — F419 Anxiety disorder, unspecified: Secondary | ICD-10-CM | POA: Diagnosis present

## 2020-08-28 DIAGNOSIS — E669 Obesity, unspecified: Secondary | ICD-10-CM | POA: Diagnosis present

## 2020-08-28 DIAGNOSIS — Z6834 Body mass index (BMI) 34.0-34.9, adult: Secondary | ICD-10-CM | POA: Diagnosis not present

## 2020-08-28 DIAGNOSIS — Z8249 Family history of ischemic heart disease and other diseases of the circulatory system: Secondary | ICD-10-CM | POA: Diagnosis not present

## 2020-08-28 DIAGNOSIS — G9782 Other postprocedural complications and disorders of nervous system: Secondary | ICD-10-CM | POA: Diagnosis present

## 2020-08-28 DIAGNOSIS — Z20822 Contact with and (suspected) exposure to covid-19: Secondary | ICD-10-CM | POA: Diagnosis present

## 2020-08-28 DIAGNOSIS — G9608 Other cranial cerebrospinal fluid leak: Principal | ICD-10-CM | POA: Diagnosis present

## 2020-08-28 DIAGNOSIS — Z833 Family history of diabetes mellitus: Secondary | ICD-10-CM | POA: Diagnosis not present

## 2020-08-28 DIAGNOSIS — Y838 Other surgical procedures as the cause of abnormal reaction of the patient, or of later complication, without mention of misadventure at the time of the procedure: Secondary | ICD-10-CM | POA: Diagnosis present

## 2020-08-28 HISTORY — PX: SUBOCCIPITAL CRANIECTOMY CERVICAL LAMINECTOMY: SHX5404

## 2020-08-28 LAB — GLUCOSE, CAPILLARY: Glucose-Capillary: 123 mg/dL — ABNORMAL HIGH (ref 70–99)

## 2020-08-28 LAB — BASIC METABOLIC PANEL
Anion gap: 9 (ref 5–15)
BUN: 11 mg/dL (ref 6–20)
CO2: 26 mmol/L (ref 22–32)
Calcium: 9.1 mg/dL (ref 8.9–10.3)
Chloride: 100 mmol/L (ref 98–111)
Creatinine, Ser: 1.03 mg/dL — ABNORMAL HIGH (ref 0.44–1.00)
GFR, Estimated: >60 mL/min (ref >60)
Glucose, Bld: 145 mg/dL — ABNORMAL HIGH (ref 70–99)
Potassium: 3.5 mmol/L (ref 3.5–5.1)
Sodium: 135 mmol/L (ref 135–145)

## 2020-08-28 LAB — CBC
HCT: 38.6 % (ref 36.0–46.0)
Hemoglobin: 12.9 g/dL (ref 12.0–15.0)
MCH: 29.5 pg (ref 26.0–34.0)
MCHC: 33.4 g/dL (ref 30.0–36.0)
MCV: 88.1 fL (ref 80.0–100.0)
Platelets: 385 10*3/uL (ref 150–400)
RBC: 4.38 MIL/uL (ref 3.87–5.11)
RDW: 11.7 % (ref 11.5–15.5)
WBC: 8.1 10*3/uL (ref 4.0–10.5)
nRBC: 0 % (ref 0.0–0.2)

## 2020-08-28 LAB — PREGNANCY, URINE: Preg Test, Ur: NEGATIVE

## 2020-08-28 SURGERY — SUBOCCIPITAL CRANIECTOMY CERVICAL LAMINECTOMY/DURAPLASTY
Anesthesia: General | Site: Head

## 2020-08-28 MED ORDER — HYDROMORPHONE HCL 1 MG/ML IJ SOLN
0.5000 mg | INTRAMUSCULAR | Status: DC | PRN
  Administered 2020-08-28 – 2020-08-29 (×2): 0.5 mg via INTRAVENOUS
  Administered 2020-08-29: 1 mg via INTRAVENOUS
  Filled 2020-08-28 (×3): qty 1

## 2020-08-28 MED ORDER — CHLORHEXIDINE GLUCONATE 0.12 % MT SOLN
OROMUCOSAL | Status: AC
  Filled 2020-08-28: qty 15

## 2020-08-28 MED ORDER — THROMBIN 20000 UNITS EX SOLR
CUTANEOUS | Status: DC | PRN
  Administered 2020-08-28: 20 mL via TOPICAL

## 2020-08-28 MED ORDER — THROMBIN (RECOMBINANT) 20000 UNITS EX SOLR
CUTANEOUS | Status: AC
  Filled 2020-08-28: qty 20000

## 2020-08-28 MED ORDER — PROPOFOL 10 MG/ML IV BOLUS
INTRAVENOUS | Status: AC
  Filled 2020-08-28: qty 20

## 2020-08-28 MED ORDER — ONDANSETRON HCL 4 MG/2ML IJ SOLN
INTRAMUSCULAR | Status: DC | PRN
  Administered 2020-08-28: 4 mg via INTRAVENOUS

## 2020-08-28 MED ORDER — 0.9 % SODIUM CHLORIDE (POUR BTL) OPTIME
TOPICAL | Status: DC | PRN
  Administered 2020-08-28: 1000 mL

## 2020-08-28 MED ORDER — CEFAZOLIN SODIUM 1 G IJ SOLR
INTRAMUSCULAR | Status: AC
  Filled 2020-08-28: qty 20

## 2020-08-28 MED ORDER — LIDOCAINE 2% (20 MG/ML) 5 ML SYRINGE
INTRAMUSCULAR | Status: DC | PRN
  Administered 2020-08-28: 60 mg via INTRAVENOUS

## 2020-08-28 MED ORDER — PHENYLEPHRINE 40 MCG/ML (10ML) SYRINGE FOR IV PUSH (FOR BLOOD PRESSURE SUPPORT)
PREFILLED_SYRINGE | INTRAVENOUS | Status: AC
  Filled 2020-08-28: qty 10

## 2020-08-28 MED ORDER — THROMBIN 5000 UNITS EX SOLR
OROMUCOSAL | Status: DC | PRN
  Administered 2020-08-28: 5 mL via TOPICAL

## 2020-08-28 MED ORDER — SUGAMMADEX SODIUM 200 MG/2ML IV SOLN
INTRAVENOUS | Status: DC | PRN
  Administered 2020-08-28: 200 mg via INTRAVENOUS

## 2020-08-28 MED ORDER — BUPIVACAINE-EPINEPHRINE 0.5% -1:200000 IJ SOLN
INTRAMUSCULAR | Status: AC
  Filled 2020-08-28: qty 1

## 2020-08-28 MED ORDER — SUCCINYLCHOLINE CHLORIDE 200 MG/10ML IV SOSY
PREFILLED_SYRINGE | INTRAVENOUS | Status: AC
  Filled 2020-08-28: qty 10

## 2020-08-28 MED ORDER — MIDAZOLAM HCL 2 MG/2ML IJ SOLN
INTRAMUSCULAR | Status: AC
  Filled 2020-08-28: qty 2

## 2020-08-28 MED ORDER — SODIUM CHLORIDE 0.9 % IV SOLN: INTRAVENOUS | Status: DC

## 2020-08-28 MED ORDER — PROPOFOL 500 MG/50ML IV EMUL
INTRAVENOUS | Status: DC | PRN
  Administered 2020-08-28: 20 ug/kg/min via INTRAVENOUS

## 2020-08-28 MED ORDER — METHOCARBAMOL 1000 MG/10ML IJ SOLN
500.0000 mg | Freq: Four times a day (QID) | INTRAVENOUS | Status: DC | PRN
  Filled 2020-08-28 (×3): qty 5

## 2020-08-28 MED ORDER — MIDAZOLAM HCL 5 MG/5ML IJ SOLN
INTRAMUSCULAR | Status: DC | PRN
  Administered 2020-08-28: 2 mg via INTRAVENOUS

## 2020-08-28 MED ORDER — ROCURONIUM BROMIDE 10 MG/ML (PF) SYRINGE
PREFILLED_SYRINGE | INTRAVENOUS | Status: DC | PRN
  Administered 2020-08-28: 50 mg via INTRAVENOUS

## 2020-08-28 MED ORDER — POLYETHYLENE GLYCOL 3350 17 G PO PACK
17.0000 g | PACK | Freq: Every day | ORAL | Status: DC | PRN
  Administered 2020-08-30: 17 g via ORAL
  Filled 2020-08-28: qty 1

## 2020-08-28 MED ORDER — HYDROMORPHONE HCL 1 MG/ML IJ SOLN
0.5000 mg | INTRAMUSCULAR | Status: AC | PRN
  Administered 2020-08-28 – 2020-08-29 (×4): 0.5 mg via INTRAVENOUS

## 2020-08-28 MED ORDER — SUCCINYLCHOLINE CHLORIDE 200 MG/10ML IV SOSY
PREFILLED_SYRINGE | INTRAVENOUS | Status: DC | PRN
  Administered 2020-08-28: 100 mg via INTRAVENOUS

## 2020-08-28 MED ORDER — BACITRACIN ZINC 500 UNIT/GM EX OINT
TOPICAL_OINTMENT | CUTANEOUS | Status: AC
  Filled 2020-08-28: qty 28.35

## 2020-08-28 MED ORDER — PHENYLEPHRINE HCL (PRESSORS) 10 MG/ML IV SOLN
INTRAVENOUS | Status: DC | PRN
  Administered 2020-08-28: 40 ug via INTRAVENOUS
  Administered 2020-08-28: 80 ug via INTRAVENOUS

## 2020-08-28 MED ORDER — DIPHENHYDRAMINE HCL 50 MG/ML IJ SOLN
INTRAMUSCULAR | Status: DC | PRN
  Administered 2020-08-28: 12.5 mg via INTRAVENOUS

## 2020-08-28 MED ORDER — FENTANYL CITRATE (PF) 250 MCG/5ML IJ SOLN
INTRAMUSCULAR | Status: AC
  Filled 2020-08-28: qty 5

## 2020-08-28 MED ORDER — ARTIFICIAL TEARS OPHTHALMIC OINT
TOPICAL_OINTMENT | OPHTHALMIC | Status: AC
  Filled 2020-08-28: qty 3.5

## 2020-08-28 MED ORDER — ACETAMINOPHEN 325 MG PO TABS: 650.0000 mg | ORAL_TABLET | Freq: Four times a day (QID) | ORAL | Status: DC | PRN

## 2020-08-28 MED ORDER — DOCUSATE SODIUM 100 MG PO CAPS
100.0000 mg | ORAL_CAPSULE | Freq: Two times a day (BID) | ORAL | Status: DC
  Administered 2020-08-29 – 2020-08-31 (×5): 100 mg via ORAL
  Filled 2020-08-28 (×5): qty 1

## 2020-08-28 MED ORDER — ONDANSETRON HCL 4 MG/2ML IJ SOLN
4.0000 mg | Freq: Four times a day (QID) | INTRAMUSCULAR | Status: DC | PRN
  Administered 2020-08-29 – 2020-08-30 (×3): 4 mg via INTRAVENOUS
  Filled 2020-08-28 (×3): qty 2

## 2020-08-28 MED ORDER — ONDANSETRON HCL 4 MG/2ML IJ SOLN
INTRAMUSCULAR | Status: AC
  Filled 2020-08-28: qty 2

## 2020-08-28 MED ORDER — ACETAMINOPHEN 650 MG RE SUPP: 650.0000 mg | Freq: Four times a day (QID) | RECTAL | Status: DC | PRN

## 2020-08-28 MED ORDER — LACTATED RINGERS IV SOLN: INTRAVENOUS | Status: DC | PRN

## 2020-08-28 MED ORDER — EPHEDRINE 5 MG/ML INJ
INTRAVENOUS | Status: AC
  Filled 2020-08-28: qty 10

## 2020-08-28 MED ORDER — OXYCODONE HCL 5 MG PO TABS: 5.0000 mg | ORAL_TABLET | ORAL | Status: DC | PRN

## 2020-08-28 MED ORDER — CEFAZOLIN SODIUM-DEXTROSE 2-3 GM-%(50ML) IV SOLR
INTRAVENOUS | Status: DC | PRN
  Administered 2020-08-28: 2 g via INTRAVENOUS

## 2020-08-28 MED ORDER — HYDROMORPHONE HCL 1 MG/ML IJ SOLN
INTRAMUSCULAR | Status: AC
  Filled 2020-08-28: qty 1

## 2020-08-28 MED ORDER — DIPHENHYDRAMINE HCL 50 MG/ML IJ SOLN
INTRAMUSCULAR | Status: AC
  Filled 2020-08-28: qty 1

## 2020-08-28 MED ORDER — DEXAMETHASONE SODIUM PHOSPHATE 10 MG/ML IJ SOLN
INTRAMUSCULAR | Status: AC
  Filled 2020-08-28: qty 1

## 2020-08-28 MED ORDER — FENTANYL CITRATE (PF) 100 MCG/2ML IJ SOLN
25.0000 ug | INTRAMUSCULAR | Status: DC | PRN
  Administered 2020-08-28 (×2): 50 ug via INTRAVENOUS

## 2020-08-28 MED ORDER — PROMETHAZINE HCL 25 MG/ML IJ SOLN: 6.2500 mg | INTRAMUSCULAR | Status: DC | PRN

## 2020-08-28 MED ORDER — BACITRACIN ZINC 500 UNIT/GM EX OINT
TOPICAL_OINTMENT | CUTANEOUS | Status: DC | PRN
  Administered 2020-08-28: 1 via TOPICAL

## 2020-08-28 MED ORDER — DEXAMETHASONE SODIUM PHOSPHATE 10 MG/ML IJ SOLN
INTRAMUSCULAR | Status: DC | PRN
  Administered 2020-08-28: 10 mg via INTRAVENOUS

## 2020-08-28 MED ORDER — PROPOFOL 10 MG/ML IV BOLUS
INTRAVENOUS | Status: DC | PRN
  Administered 2020-08-28: 60 mg via INTRAVENOUS
  Administered 2020-08-28: 200 mg via INTRAVENOUS

## 2020-08-28 MED ORDER — FENTANYL CITRATE (PF) 100 MCG/2ML IJ SOLN
INTRAMUSCULAR | Status: AC
  Filled 2020-08-28: qty 2

## 2020-08-28 MED ORDER — ONDANSETRON HCL 4 MG PO TABS: 4.0000 mg | ORAL_TABLET | Freq: Four times a day (QID) | ORAL | Status: DC | PRN

## 2020-08-28 MED ORDER — THROMBIN 5000 UNITS EX SOLR
CUTANEOUS | Status: AC
  Filled 2020-08-28: qty 5000

## 2020-08-28 MED ORDER — FENTANYL CITRATE (PF) 250 MCG/5ML IJ SOLN
INTRAMUSCULAR | Status: DC | PRN
  Administered 2020-08-28: 100 ug via INTRAVENOUS
  Administered 2020-08-28 (×3): 50 ug via INTRAVENOUS

## 2020-08-28 MED ORDER — ROCURONIUM BROMIDE 10 MG/ML (PF) SYRINGE
PREFILLED_SYRINGE | INTRAVENOUS | Status: AC
  Filled 2020-08-28: qty 10

## 2020-08-28 MED ORDER — LIDOCAINE 2% (20 MG/ML) 5 ML SYRINGE
INTRAMUSCULAR | Status: AC
  Filled 2020-08-28: qty 5

## 2020-08-28 MED ORDER — BISACODYL 10 MG RE SUPP: 10.0000 mg | Freq: Every day | RECTAL | Status: DC | PRN

## 2020-08-28 SURGICAL SUPPLY — 62 items
BAND RUBBER #18 3X1/16 STRL (MISCELLANEOUS) IMPLANT
BENZOIN TINCTURE PRP APPL 2/3 (GAUZE/BANDAGES/DRESSINGS) IMPLANT
BLADE CLIPPER SPEC (BLADE) ×6 IMPLANT
BLADE ULTRA TIP 2M (BLADE) IMPLANT
BUR MATCHSTICK NEURO 3.0 LAGG (BURR) IMPLANT
BUR PRECISION FLUTE 6.0 (BURR) ×3 IMPLANT
CANISTER SUCT 3000ML PPV (MISCELLANEOUS) ×3 IMPLANT
CARTRIDGE OIL MAESTRO DRILL (MISCELLANEOUS) IMPLANT
CLIP VESOCCLUDE MED 6/CT (CLIP) IMPLANT
COVER MAYO STAND STRL (DRAPES) IMPLANT
COVER WAND RF STERILE (DRAPES) ×3 IMPLANT
DIFFUSER DRILL AIR PNEUMATIC (MISCELLANEOUS) ×3 IMPLANT
DRAPE LAPAROTOMY 100X72 PEDS (DRAPES) ×3 IMPLANT
DRAPE MICROSCOPE LEICA (MISCELLANEOUS) IMPLANT
DRAPE SURG 17X23 STRL (DRAPES) ×6 IMPLANT
DRAPE WARM FLUID 44X44 (DRAPES) ×3 IMPLANT
DRSG OPSITE POSTOP 4X6 (GAUZE/BANDAGES/DRESSINGS) ×3 IMPLANT
ELECT BLADE 4.0 EZ CLEAN MEGAD (MISCELLANEOUS) ×3
ELECT REM PT RETURN 9FT ADLT (ELECTROSURGICAL) ×3
ELECTRODE BLDE 4.0 EZ CLN MEGD (MISCELLANEOUS) ×1 IMPLANT
ELECTRODE REM PT RTRN 9FT ADLT (ELECTROSURGICAL) ×1 IMPLANT
EVACUATOR 1/8 PVC DRAIN (DRAIN) IMPLANT
EVACUATOR SILICONE 100CC (DRAIN) IMPLANT
GAUZE 4X4 16PLY RFD (DISPOSABLE) IMPLANT
GAUZE SPONGE 4X4 12PLY STRL (GAUZE/BANDAGES/DRESSINGS) IMPLANT
GLOVE EXAM NITRILE XL STR (GLOVE) IMPLANT
GLOVE SURG ENC MOIS LTX SZ8 (GLOVE) ×3 IMPLANT
GLOVE SURG ENC MOIS LTX SZ8.5 (GLOVE) ×3 IMPLANT
GOWN STRL REUS W/ TWL LRG LVL3 (GOWN DISPOSABLE) ×2 IMPLANT
GOWN STRL REUS W/ TWL XL LVL3 (GOWN DISPOSABLE) ×1 IMPLANT
GOWN STRL REUS W/TWL LRG LVL3 (GOWN DISPOSABLE) ×6
GOWN STRL REUS W/TWL XL LVL3 (GOWN DISPOSABLE) ×3
KIT BASIN OR (CUSTOM PROCEDURE TRAY) ×3 IMPLANT
KIT TURNOVER KIT B (KITS) ×3 IMPLANT
MARKER SKIN DUAL TIP RULER LAB (MISCELLANEOUS) ×3 IMPLANT
NEEDLE HYPO 22GX1.5 SAFETY (NEEDLE) ×3 IMPLANT
NS IRRIG 1000ML POUR BTL (IV SOLUTION) ×3 IMPLANT
OIL CARTRIDGE MAESTRO DRILL (MISCELLANEOUS)
PACK CRANIOTOMY CUSTOM (CUSTOM PROCEDURE TRAY) ×3 IMPLANT
PAD ARMBOARD 7.5X6 YLW CONV (MISCELLANEOUS) ×9 IMPLANT
PATTIES SURGICAL 1/4 X 3 (GAUZE/BANDAGES/DRESSINGS) IMPLANT
PIN MAYFIELD SKULL DISP (PIN) ×3 IMPLANT
SEALANT ADHERUS EXTEND TIP (MISCELLANEOUS) ×6 IMPLANT
SPONGE LAP 4X18 RFD (DISPOSABLE) IMPLANT
STAPLER SKIN PROX WIDE 3.9 (STAPLE) IMPLANT
SUT ETHILON 2 0 FS 18 (SUTURE) IMPLANT
SUT ETHILON 2 0 PSLX (SUTURE) ×3 IMPLANT
SUT ETHILON 3 0 FSL (SUTURE) IMPLANT
SUT NURALON 4 0 TR CR/8 (SUTURE) IMPLANT
SUT PROLENE 6 0 BV (SUTURE) ×6 IMPLANT
SUT VIC AB 0 CT1 18XCR BRD8 (SUTURE) ×1 IMPLANT
SUT VIC AB 0 CT1 8-18 (SUTURE) ×3
SUT VIC AB 1 CT1 18XBRD ANBCTR (SUTURE) ×1 IMPLANT
SUT VIC AB 1 CT1 8-18 (SUTURE) ×3
SUT VIC AB 2-0 CP2 18 (SUTURE) ×3 IMPLANT
SUT VIC AB 3-0 SH 8-18 (SUTURE) IMPLANT
TOWEL GREEN STERILE (TOWEL DISPOSABLE) ×3 IMPLANT
TOWEL GREEN STERILE FF (TOWEL DISPOSABLE) IMPLANT
TRAY FOLEY MTR SLVR 16FR STAT (SET/KITS/TRAYS/PACK) IMPLANT
TRAY LUMBAR PUNCTURE AD (SET/KITS/TRAYS/PACK) ×3 IMPLANT
UNDERPAD 30X36 HEAVY ABSORB (UNDERPADS AND DIAPERS) IMPLANT
WATER STERILE IRR 1000ML POUR (IV SOLUTION) ×3 IMPLANT

## 2020-08-28 NOTE — Anesthesia Preprocedure Evaluation (Signed)
Anesthesia Evaluation  Patient identified by MRN, date of birth, ID band Patient awake    Reviewed: Allergy & Precautions, NPO status , Patient's Chart, lab work & pertinent test results  Airway Mallampati: II  TM Distance: >3 FB Neck ROM: Full    Dental no notable dental hx. (+) Dental Advisory Given, Teeth Intact   Pulmonary neg pulmonary ROS,    Pulmonary exam normal breath sounds clear to auscultation       Cardiovascular negative cardio ROS Normal cardiovascular exam Rhythm:Regular Rate:Normal     Neuro/Psych  Headaches, PSYCHIATRIC DISORDERS Anxiety    GI/Hepatic negative GI ROS, Neg liver ROS,   Endo/Other  negative endocrine ROS  Renal/GU negative Renal ROS     Musculoskeletal negative musculoskeletal ROS (+)   Abdominal (+) + obese,   Peds  Hematology  (+) Blood dyscrasia, anemia ,   Anesthesia Other Findings   Reproductive/Obstetrics                             Anesthesia Physical  Anesthesia Plan  ASA: 2  Anesthesia Plan: General   Post-op Pain Management:    Induction: Intravenous  PONV Risk Score and Plan: 4 or greater and Ondansetron, Dexamethasone, Propofol infusion, Treatment may vary due to age or medical condition, Midazolam, Diphenhydramine and Scopolamine patch - Pre-op  Airway Management Planned: Oral ETT  Additional Equipment:   Intra-op Plan:   Post-operative Plan: Possible Post-op intubation/ventilation  Informed Consent: I have reviewed the patients History and Physical, chart, labs and discussed the procedure including the risks, benefits and alternatives for the proposed anesthesia with the patient or authorized representative who has indicated his/her understanding and acceptance.     Dental advisory given  Plan Discussed with: CRNA  Anesthesia Plan Comments:         Anesthesia Quick Evaluation

## 2020-08-28 NOTE — Transfer of Care (Signed)
Immediate Anesthesia Transfer of Care Note  Patient: Shelly Leblanc  Procedure(s) Performed: Exploration of surgical wound, repair of CSF leak (Head)  Patient Location: PACU  Anesthesia Type:General  Level of Consciousness: drowsy  Airway & Oxygen Therapy: Patient Spontanous Breathing  Post-op Assessment: Report given to RN and Post -op Vital signs reviewed and stable  Post vital signs: Reviewed and stable  Last Vitals:  Vitals Value Taken Time  BP    Temp    Pulse    Resp    SpO2      Last Pain:  Vitals:   08/28/20 1934  TempSrc: Oral  PainSc: 7       Patients Stated Pain Goal: 0 (08/28/20 1934)  Complications: No notable events documented.

## 2020-08-28 NOTE — Op Note (Signed)
Brief history: The patient is a 25 year old black female on whom Dr. Johnsie Cancel performed a Chiari decompression about 2 weeks ago.  She did well until couple days ago when she developed headaches and developed leakage out of her wound today consistent with a CSF fistula.  I discussed the situation with the patient and recommended surgery.  She has decided to proceed with a exploration of her wound and repair of CSF fistula after weighing the risk, benefits and alternatives.  Pre-op diagnosis: CSF fistula  Postop diagnosis: The same  Procedure: Exploration of suboccipital and cervical wound with repair of CSF fistula  Surgeon: Dr. Delma Officer  Assistant: Hildred Priest, NP  Anesthesia: General tracheal  Estimated blood loss: Minimal  Specimens: Wound cultures and CSF cultures  Complications: None  Drains: None  Description of procedure: The patient was brought to the operating room by the anesthesia team.  General endotracheal anesthesia was induced.  I then applied the [place] three-point headrest to the patient's calvarium.  The patient was then turned to the prone position on the chest rolls.  The patient's suboccipital region was then shaved with clippers and this region as well as her posterior cervical region was then prepared with Betadine scrub and Betadine solution.  Sterile drapes were applied.  I then used a scalpel to incise through the patient's fresh surgical scar.  We immediately countered watery fluid consistent with bloody CSF.  We obtain cultures.  We also obtained wound cultures.  I inserted the cerebellar retractor for exposure.  The patient's duraplasty was easily exposed.  There was some dural sealant which I removed with suction.  I inspected the suture line.  There was 2 areas of obvious leakage, 1 at the lower aspect of the cervical durotomy and 1 where the dural graft had folded over itself.  I repaired both areas of leakage with interrupted and figure-of-eight 6-0  Prolene sutures.  At this point we appeared to have a good dural closure.  I had anesthesia Valsalva the patient it did not see any further leakage.  We then placed DuraSeal over the duraplasty suture lines.  Obtain hemostasis with bipolar cautery.  We then remove the retractor.  We reapproximated the patient's cervical fascia with interrupted 0 Vicryl suture.  We reapproximated the subcutaneous tissue with interrupted 2-0 Vicryl suture.  We reapproximated the skin with a running 2-0 nylon suture.  The wound was then coated with bacitracin ointment.  A sterile dressing was applied.  The drapes were removed.  She was then returned to the supine position.  I then remove the [place] three-point headrest from her calvarium.  By report all sponge, instrument, and needle counts were correct at the end this case.

## 2020-08-28 NOTE — Progress Notes (Addendum)
Subjective: The patient is a 25 year old black female on whom Dr. Johnsie Cancel performed a Chiari decompression about 2 weeks ago.  About 3 days ago she began having severe headaches.  She noticed a lot of watery drainage from the back of her neck/head today.  We brought her into the office where she clearly had a CSF leak.  We recommended surgery.  She was admitted.  Objective: Vital signs in last 24 hours: Temp:  [98.5 F (36.9 C)-99.3 F (37.4 C)] 98.5 F (36.9 C) (06/27 1934) Pulse Rate:  [102] 102 (06/27 1934) Resp:  [18] 18 (06/27 1934) BP: (127-136)/(85-89) 127/85 (06/27 1934) SpO2:  [100 %] 100 % (06/27 1934) Weight:  [89.1 kg] 89.1 kg (06/27 2040) Estimated body mass index is 34.8 kg/m as calculated from the following:   Height as of 08/14/20: 5\' 3"  (1.6 m).   Weight as of this encounter: 89.1 kg.   Intake/Output from previous day: No intake/output data recorded. Intake/Output this shift: No intake/output data recorded.  Physical exam the patient is alert and pleasant.  She is moving all 4 extremities well.  Lab Results: Recent Labs    08/28/20 1808  WBC 8.1  HGB 12.9  HCT 38.6  PLT 385   BMET Recent Labs    08/28/20 1808  NA 135  K 3.5  CL 100  CO2 26  GLUCOSE 145*  BUN 11  CREATININE 1.03*  CALCIUM 9.1    Studies/Results: No results found.  Assessment/Plan: CSF fistula: I have discussed the situation with the patient.  I recommended an exploration of her cervical/suboccipital wound with repair of CSF fistula.  We will also obtain CSF cultures.  I described the surgery to her.  We have discussed the risk including risk of anesthesia, infection, further CSF leak, etc.  I have answered all her questions.  She has decided to proceed with surgery.  LOS: 0 days     08/30/20 08/28/2020, 9:49 PM

## 2020-08-28 NOTE — Anesthesia Procedure Notes (Signed)
Procedure Name: Intubation Date/Time: 08/28/2020 10:05 PM Performed by: Claudina Lick, CRNA Pre-anesthesia Checklist: Patient identified, Emergency Drugs available, Suction available and Patient being monitored Patient Re-evaluated:Patient Re-evaluated prior to induction Oxygen Delivery Method: Circle system utilized Preoxygenation: Pre-oxygenation with 100% oxygen Induction Type: IV induction, Rapid sequence and Cricoid Pressure applied Laryngoscope Size: Glidescope and 4 Grade View: Grade I Tube type: Oral Tube size: 7.0 mm Number of attempts: 1 Airway Equipment and Method: Stylet and Video-laryngoscopy Placement Confirmation: ETT inserted through vocal cords under direct vision, positive ETCO2 and breath sounds checked- equal and bilateral Secured at: 21 cm Tube secured with: Tape Dental Injury: Teeth and Oropharynx as per pre-operative assessment

## 2020-08-29 ENCOUNTER — Encounter (HOSPITAL_COMMUNITY): Payer: Self-pay | Admitting: Neurosurgery

## 2020-08-29 DIAGNOSIS — G9608 Other cranial cerebrospinal fluid leak: Secondary | ICD-10-CM | POA: Diagnosis present

## 2020-08-29 LAB — BASIC METABOLIC PANEL
Anion gap: 10 (ref 5–15)
BUN: 11 mg/dL (ref 6–20)
CO2: 23 mmol/L (ref 22–32)
Calcium: 9.2 mg/dL (ref 8.9–10.3)
Chloride: 100 mmol/L (ref 98–111)
Creatinine, Ser: 0.99 mg/dL (ref 0.44–1.00)
GFR, Estimated: >60 mL/min (ref >60)
Glucose, Bld: 196 mg/dL — ABNORMAL HIGH (ref 70–99)
Potassium: 4.5 mmol/L (ref 3.5–5.1)
Sodium: 133 mmol/L — ABNORMAL LOW (ref 135–145)

## 2020-08-29 LAB — PROTEIN AND GLUCOSE, CSF
Glucose, CSF: 32 mg/dL — ABNORMAL LOW (ref 40–70)
Total  Protein, CSF: 476 mg/dL — ABNORMAL HIGH (ref 15–45)

## 2020-08-29 LAB — CBC
HCT: 36.2 % (ref 36.0–46.0)
Hemoglobin: 12.1 g/dL (ref 12.0–15.0)
MCH: 29.3 pg (ref 26.0–34.0)
MCHC: 33.4 g/dL (ref 30.0–36.0)
MCV: 87.7 fL (ref 80.0–100.0)
Platelets: 350 10*3/uL (ref 150–400)
RBC: 4.13 MIL/uL (ref 3.87–5.11)
RDW: 11.7 % (ref 11.5–15.5)
WBC: 10.8 10*3/uL — ABNORMAL HIGH (ref 4.0–10.5)
nRBC: 0 % (ref 0.0–0.2)

## 2020-08-29 LAB — CSF CELL COUNT WITH DIFFERENTIAL
Eosinophils, CSF: 4 % — ABNORMAL HIGH (ref 0–1)
Lymphs, CSF: 18 % — ABNORMAL LOW (ref 40–80)
Monocyte-Macrophage-Spinal Fluid: 1 % — ABNORMAL LOW (ref 15–45)
RBC Count, CSF: UNDETERMINED /mm3
Segmented Neutrophils-CSF: 77 % — ABNORMAL HIGH (ref 0–6)
Tube #: 3
WBC, CSF: UNDETERMINED /mm3 (ref 0–5)

## 2020-08-29 MED ORDER — ONDANSETRON HCL 4 MG PO TABS: 4.0000 mg | ORAL_TABLET | Freq: Four times a day (QID) | ORAL | Status: DC | PRN

## 2020-08-29 MED ORDER — OXYCODONE HCL 5 MG PO TABS
10.0000 mg | ORAL_TABLET | ORAL | Status: DC | PRN
  Administered 2020-08-30 (×2): 10 mg via ORAL
  Filled 2020-08-29 (×2): qty 2

## 2020-08-29 MED ORDER — ONDANSETRON HCL 4 MG/2ML IJ SOLN: 4.0000 mg | Freq: Four times a day (QID) | INTRAMUSCULAR | Status: DC | PRN

## 2020-08-29 MED ORDER — DOCUSATE SODIUM 100 MG PO CAPS: 100.0000 mg | ORAL_CAPSULE | Freq: Two times a day (BID) | ORAL | Status: DC

## 2020-08-29 MED ORDER — CYCLOBENZAPRINE HCL 10 MG PO TABS: 10.0000 mg | ORAL_TABLET | Freq: Three times a day (TID) | ORAL | Status: DC | PRN

## 2020-08-29 MED ORDER — CEFAZOLIN SODIUM-DEXTROSE 2-4 GM/100ML-% IV SOLN
2.0000 g | Freq: Three times a day (TID) | INTRAVENOUS | Status: AC
  Administered 2020-08-29 (×2): 2 g via INTRAVENOUS
  Filled 2020-08-29 (×3): qty 100

## 2020-08-29 MED ORDER — MECLIZINE HCL 12.5 MG PO TABS: 12.5000 mg | ORAL_TABLET | Freq: Three times a day (TID) | ORAL | Status: DC | PRN

## 2020-08-29 MED ORDER — ACETAMINOPHEN 500 MG PO TABS
1000.0000 mg | ORAL_TABLET | Freq: Four times a day (QID) | ORAL | Status: AC
  Administered 2020-08-29 (×3): 1000 mg via ORAL
  Filled 2020-08-29 (×3): qty 2

## 2020-08-29 MED ORDER — ACETAMINOPHEN 650 MG RE SUPP: 650.0000 mg | RECTAL | Status: DC | PRN

## 2020-08-29 MED ORDER — MORPHINE SULFATE (PF) 4 MG/ML IV SOLN
4.0000 mg | INTRAVENOUS | Status: DC | PRN
  Administered 2020-08-30 – 2020-08-31 (×3): 4 mg via INTRAVENOUS
  Filled 2020-08-29 (×3): qty 1

## 2020-08-29 MED ORDER — ZOLPIDEM TARTRATE 5 MG PO TABS: 5.0000 mg | ORAL_TABLET | Freq: Every evening | ORAL | Status: DC | PRN

## 2020-08-29 MED ORDER — ALUM & MAG HYDROXIDE-SIMETH 200-200-20 MG/5ML PO SUSP: 30.0000 mL | Freq: Four times a day (QID) | ORAL | Status: DC | PRN

## 2020-08-29 MED ORDER — BISACODYL 10 MG RE SUPP: 10.0000 mg | Freq: Every day | RECTAL | Status: DC | PRN

## 2020-08-29 MED ORDER — PHENOL 1.4 % MT LIQD: 1.0000 | OROMUCOSAL | Status: DC | PRN

## 2020-08-29 MED ORDER — LACTATED RINGERS IV SOLN: INTRAVENOUS | Status: DC

## 2020-08-29 MED ORDER — HYDROMORPHONE HCL 1 MG/ML IJ SOLN
INTRAMUSCULAR | Status: AC
  Filled 2020-08-29: qty 1

## 2020-08-29 MED ORDER — PANTOPRAZOLE SODIUM 40 MG IV SOLR
40.0000 mg | Freq: Every day | INTRAVENOUS | Status: DC
  Administered 2020-08-29 – 2020-08-30 (×3): 40 mg via INTRAVENOUS
  Filled 2020-08-29 (×3): qty 40

## 2020-08-29 MED ORDER — PHENOL 1.4 % MT LIQD
1.0000 | OROMUCOSAL | Status: DC | PRN
  Administered 2020-08-29: 1 via OROMUCOSAL
  Filled 2020-08-29: qty 177

## 2020-08-29 MED ORDER — MENTHOL 3 MG MT LOZG: 1.0000 | LOZENGE | OROMUCOSAL | Status: DC | PRN

## 2020-08-29 MED ORDER — ACETAMINOPHEN 325 MG PO TABS: 650.0000 mg | ORAL_TABLET | ORAL | Status: DC | PRN

## 2020-08-29 MED ORDER — OXYCODONE HCL 5 MG PO TABS: 5.0000 mg | ORAL_TABLET | ORAL | Status: DC | PRN

## 2020-08-29 MED FILL — Thrombin (Recombinant) For Soln 20000 Unit: CUTANEOUS | Qty: 1 | Status: AC

## 2020-08-29 NOTE — Evaluation (Signed)
Occupational Therapy Evaluation Patient Details Name: Shelly Leblanc MRN: [id number] DOB: Sep 08, 1995 Today's Date: 08/29/2020    History of Present Illness Pt is a 25 y.o. female who underwent repair of CSF fistula 6/27. She originally underwent decompression for Chiari malformation 6/13. PMH: anxiety   Clinical Impression   PTA, pt was independent and lived alone. Currently, pt performing functional mobility and ADLs with supervision. Noting decreased activity tolerance as seen by fatigue and SOB during basic ADLs. Recommend discharge home with no follow up OT. Will continue to follow acutely to optimize activity tolerance with ADLs.     Follow Up Recommendations  No OT follow up;Supervision - Intermittent    Equipment Recommendations  None recommended by OT    Recommendations for Other Services       Precautions / Restrictions Precautions Precautions: Fall;Cervical Precaution Booklet Issued: No Precaution Comments: no bending, arching, twisting; s/p chiari malformation decompression surgery Restrictions Weight Bearing Restrictions: No      Mobility Bed Mobility Overal bed mobility: Needs Assistance Bed Mobility: Sit to Supine;Supine to Sit     Supine to sit: Supervision;HOB elevated Sit to supine: Supervision;HOB elevated   General bed mobility comments: increased time and effort, supervision for safety; HOB elevated and PT able to bring legs off of bed in upright position. Review use of log roll technique in future sessions without HOB elevated.    Transfers Overall transfer level: Needs assistance Equipment used: Ambulation equipment used Transfers: Sit to/from Stand Sit to Stand: Supervision         General transfer comment: supervision for safety due to decreased activity tolerance    Balance Overall balance assessment: Mild deficits observed, not formally tested                                         ADL either performed or assessed  with clinical judgement   ADL Overall ADL's : Needs assistance/impaired Eating/Feeding: Modified independent;Sitting   Grooming: Supervision/safety;Standing   Upper Body Bathing: Supervision/ safety;Sitting   Lower Body Bathing: Supervison/ safety;Sit to/from stand   Upper Body Dressing : Modified independent;Sitting   Lower Body Dressing: Supervision/safety;Sit to/from stand   Toilet Transfer: Supervision/safety;Ambulation;Regular Toilet   Toileting- Architect and Hygiene: Supervision/safety;Sit to/from stand   Tub/ Shower Transfer: Min guard;Ambulation;Tub transfer   Functional mobility during ADLs: Supervision/safety General ADL Comments: Pt requiring increased time to complete tasks. Noting decreaed activity tolerance indicated by labored breathing upon return after performing 3 step path finding task. Pt perfroming tub transfer min guard for safety.     Vision Baseline Vision/History: No visual deficits Patient Visual Report: No change from baseline Vision Assessment?: No apparent visual deficits     Perception Perception Perception Tested?: No Comments: no apparent perceptual difficulty   Praxis Praxis Praxis tested?: Not tested Praxis-Other Comments: no apparent difficulty with motor planning    Pertinent Vitals/Pain Pain Assessment: Faces Pain Score: 3  Faces Pain Scale: Hurts little more Pain Location: neck, L trapezius muscle Pain Descriptors / Indicators: Burning;Sore;Discomfort Pain Intervention(s): Monitored during session;Limited activity within patient's tolerance     Hand Dominance Right   Extremity/Trunk Assessment Upper Extremity Assessment Upper Extremity Assessment: Generalized weakness   Lower Extremity Assessment Lower Extremity Assessment: Defer to PT evaluation   Cervical / Trunk Assessment Cervical / Trunk Assessment: Other exceptions Cervical / Trunk Exceptions: repair opf CSF fistula, underwent decompression for Chiari  malformation  6/13   Communication Communication Communication: No difficulties   Cognition Arousal/Alertness: Awake/alert Behavior During Therapy: WFL for tasks assessed/performed Overall Cognitive Status: Within Functional Limits for tasks assessed                                     General Comments  Mother present. Continue to review precautions in future sessions.    Exercises Total Joint Exercises Ankle Circles/Pumps: AROM;Both;10 reps;Supine   Shoulder Instructions      Home Living Family/patient expects to be discharged to:: Private residence Living Arrangements: Alone Available Help at Discharge: Family;Available 24 hours/day Type of Home: House Home Access: Level entry     Home Layout: Two level Alternate Level Stairs-Number of Steps: 14 (divided staircase with landing halfway) Alternate Level Stairs-Rails: Right Bathroom Shower/Tub: Chief Strategy Officer: Standard     Home Equipment: None   Additional Comments: Mother reporting family has been checking on her daily.      Prior Functioning/Environment Level of Independence: Independent        Comments: in school obtaining MBA, and working at [employer].        OT Problem List: Decreased activity tolerance      OT Treatment/Interventions:      OT Goals(Current goals can be found in the care plan section) Acute Rehab OT Goals Patient Stated Goal: to heal and not have to come back to the hospital OT Goal Formulation: With patient/family Time For Goal Achievement: 09/12/20 Potential to Achieve Goals: Good ADL Goals Additional ADL Goal #1: Pt will tolerate 5 step path finding task with mod I. Additional ADL Goal #2: Pt will tolerate 3 ADL tasks with mod I.  OT Frequency:     Barriers to D/C:            Co-evaluation              AM-PAC OT "6 Clicks" Daily Activity     Outcome Measure Help from another person eating meals?: None Help from another  person taking care of personal grooming?: A Little Help from another person toileting, which includes using toliet, bedpan, or urinal?: A Little Help from another person bathing (including washing, rinsing, drying)?: A Little Help from another person to put on and taking off regular upper body clothing?: None Help from another person to put on and taking off regular lower body clothing?: A Little 6 Click Score: 20   End of Session Equipment Utilized During Treatment: Gait belt Nurse Communication: Mobility status  Activity Tolerance: Patient tolerated treatment well Patient left: in bed;with call bell/phone within reach;with family/visitor present  OT Visit Diagnosis: Unsteadiness on feet (R26.81);Pain Pain - Right/Left: Left Pain - part of body: Shoulder (trapezuis area)                Time: 2130-8657 OT Time Calculation (min): 18 min Charges:  OT General Charges $OT Visit: 1 Visit OT Evaluation $OT Eval Low Complexity: 1 Low  Ladene Artist, OTDS   Ladene Artist 08/29/2020, 12:43 PM

## 2020-08-29 NOTE — Anesthesia Postprocedure Evaluation (Signed)
Anesthesia Post Note  Patient: Hospital doctor L Saltos  Procedure(s) Performed: Exploration of surgical wound, repair of CSF leak (Head)     Patient location during evaluation: PACU Anesthesia Type: General Level of consciousness: awake and alert Pain management: pain level controlled Vital Signs Assessment: post-procedure vital signs reviewed and stable Respiratory status: spontaneous breathing, nonlabored ventilation, respiratory function stable and patient connected to nasal cannula oxygen Cardiovascular status: blood pressure returned to baseline and stable Postop Assessment: no apparent nausea or vomiting Anesthetic complications: no   No notable events documented.  Last Vitals:  Vitals:   08/29/20 0040 08/29/20 0055  BP: (!) 135/91 (!) 145/91  Pulse: (!) 104 98  Resp: 11 16  Temp: 36.7 C 36.7 C  SpO2: 99% 95%    Last Pain:  Vitals:   08/29/20 0040  TempSrc:   PainSc: Asleep                 Kennieth Rad

## 2020-08-29 NOTE — Progress Notes (Signed)
Per Grover Canavan, "pt can have diet as tolerated".

## 2020-08-29 NOTE — Plan of Care (Signed)

## 2020-08-29 NOTE — Evaluation (Signed)
Physical Therapy Evaluation Patient Details Name: Shelly Leblanc MRN: [id number] DOB: 09/24/1995 Today's Date: 08/29/2020   History of Present Illness  Pt is a 25 y.o. female who underwent repair of CSF fistula 6/27. She originally underwent decompression for Chiari malformation 6/13. PMH: anxiety   Clinical Impression  Pt admitted with above diagnosis. PTA pt lived alone, independent. On eval, she required min guard assist bed mobility, min guard assist transfers, and min guard assist ambulation 175' with/without RW. LOB x 1 while ambulating without RW but pt able to self correct. Pt with c/o burning pain L trapezius muscle. Concerned RW use may exacerbate pain. Pt also with c/o nausea. Pt currently with functional limitations due to the deficits listed below (see PT Problem List). Pt will benefit from skilled PT to increase their independence and safety with mobility to allow discharge home. Suspect pt will progress quickly with mobility. PT to further assess need for RW upon d/c.        Follow Up Recommendations No PT follow up;Supervision for mobility/OOB    Equipment Recommendations  Rolling walker with 5" wheels (pending pt progress)    Recommendations for Other Services       Precautions / Restrictions Precautions Precautions: Fall Restrictions Weight Bearing Restrictions: No      Mobility  Bed Mobility Overal bed mobility: Needs Assistance Bed Mobility: Supine to Sit;Sit to Supine     Supine to sit: Min guard;HOB elevated Sit to supine: Min guard;HOB elevated   General bed mobility comments: increased time and effort, min guard for safety and cueing    Transfers Overall transfer level: Needs assistance Equipment used: Ambulation equipment used Transfers: Sit to/from Stand Sit to Stand: Min guard         General transfer comment: min guard for safety  Ambulation/Gait Ambulation/Gait assistance: Min guard Gait Distance (Feet): 175 Feet Assistive device:  Rolling walker (2 wheeled);None Gait Pattern/deviations: Step-through pattern;Decreased stride length Gait velocity: decreased Gait velocity interpretation: <1.31 ft/sec, indicative of household ambulator General Gait Details: ambulated with and without RW. LOB during ambulation with RW but pt able to self correct. Pt with c/o pain L traps. Concerned that RW use may exacerbate the pain. Pt feels more stable with RW.  Stairs            Wheelchair Mobility    Modified Rankin (Stroke Patients Only)       Balance Overall balance assessment: Mild deficits observed, not formally tested                                           Pertinent Vitals/Pain Pain Assessment: 0-10 Pain Score: 3  Pain Location: neck, L trapezius muscle Pain Descriptors / Indicators: Burning;Sore;Discomfort Pain Intervention(s): Monitored during session;Heat applied (heat to trap muscle belly, avoiding incision site)    Home Living Family/patient expects to be discharged to:: Private residence Living Arrangements: Alone Available Help at Discharge: Family;Available 24 hours/day Type of Home: House Home Access: Level entry     Home Layout: Two level Home Equipment: None      Prior Function Level of Independence: Independent               Hand Dominance   Dominant Hand: Right    Extremity/Trunk Assessment   Upper Extremity Assessment Upper Extremity Assessment: Defer to OT evaluation    Lower Extremity Assessment Lower Extremity Assessment: Overall [facility]  for tasks assessed    Cervical / Trunk Assessment Cervical / Trunk Assessment: Normal  Communication   Communication: No difficulties  Cognition Arousal/Alertness: Awake/alert Behavior During Therapy: WFL for tasks assessed/performed Overall Cognitive Status: Within Functional Limits for tasks assessed                                        General Comments General comments (skin integrity,  edema, etc.): Mother present in room. VSS    Exercises Total Joint Exercises Ankle Circles/Pumps: AROM;Both;10 reps;Supine   Assessment/Plan    PT Assessment Patient needs continued PT services  PT Problem List Decreased mobility;Pain;Decreased balance;Decreased knowledge of use of DME;Decreased activity tolerance       PT Treatment Interventions Gait training;Stair training;Functional mobility training;Therapeutic activities;Therapeutic exercise;Balance training;Patient/family education;DME instruction    PT Goals (Current goals can be found in the Care Plan section)  Acute Rehab PT Goals Patient Stated Goal: independence PT Goal Formulation: With patient/family Time For Goal Achievement: 09/12/20 Potential to Achieve Goals: Good    Frequency Min 5X/week   Barriers to discharge        Co-evaluation               AM-PAC PT "6 Clicks" Mobility  Outcome Measure Help needed turning from your back to your side while in a flat bed without using bedrails?: None Help needed moving from lying on your back to sitting on the side of a flat bed without using bedrails?: A Little Help needed moving to and from a bed to a chair (including a wheelchair)?: A Little Help needed standing up from a chair using your arms (e.g., wheelchair or bedside chair)?: A Little Help needed to walk in hospital room?: A Little Help needed climbing 3-5 steps with a railing? : A Little 6 Click Score: 19    End of Session Equipment Utilized During Treatment: Gait belt Activity Tolerance: Patient tolerated treatment well Patient left: in bed;with call bell/phone within reach;with family/visitor present Nurse Communication: Mobility status PT Visit Diagnosis: Unsteadiness on feet (R26.81);Pain    Time: 9833-8250 PT Time Calculation (min) (ACUTE ONLY): 20 min   Charges:   PT Evaluation $PT Eval Moderate Complexity: 1 Mod          Aida Raider, PT  Office # [phone] Pager  [phone]   Ilda Foil 08/29/2020, 10:23 AM

## 2020-08-29 NOTE — Progress Notes (Signed)
Subjective: The patient is alert and pleasant.  Her neck is appropriately sore.  Her mother is at the bedside.  Objective: Vital signs in last 24 hours: Temp:  [98 F (36.7 C)-99.3 F (37.4 C)] 98.4 F (36.9 C) (06/28 0726) Pulse Rate:  [94-107] 97 (06/28 0726) Resp:  [10-19] 18 (06/28 0726) BP: (123-147)/(62-94) 129/83 (06/28 0726) SpO2:  [95 %-100 %] 100 % (06/28 0726) Weight:  [89.1 kg] 89.1 kg (06/27 2040) Estimated body mass index is 34.8 kg/m as calculated from the following:   Height as of 08/14/20: 5\' 3"  (1.6 m).   Weight as of this encounter: 89.1 kg.   Intake/Output from previous day: 06/27 0701 - 06/28 0700 In: 1420 [I.V.:1351; IV Piggyback:69] Out: 325 [Urine:300; Blood:25] Intake/Output this shift: No intake/output data recorded.  Physical exam the patient is alert and oriented.  She is moving all 4 extremities well.  Her dressing has a small bloodstain.  Lab Results: Recent Labs    08/28/20 1808 08/29/20 0423  WBC 8.1 10.8*  HGB 12.9 12.1  HCT 38.6 36.2  PLT 385 350   BMET Recent Labs    08/28/20 1808 08/29/20 0423  NA 135 133*  K 3.5 4.5  CL 100 100  CO2 26 23  GLUCOSE 145* 196*  BUN 11 11  CREATININE 1.03* 0.99  CALCIUM 9.1 9.2    Studies/Results: No results found.  Assessment/Plan: Postop day #1: The patient seem to be doing better.  I do not see any signs of CSF leakage.  We will await the cultures.  I have answered all their questions.  LOS: 1 day     08/31/20 08/29/2020, 7:39 AM

## 2020-08-29 NOTE — Progress Notes (Signed)
Pt's wound dressing noted to be saturated w/ a clear yellowish tinge fluid. Drsg changed, no signs of infection, site appeared dry and attached. The on-call MD for Neurosurgery paged and on -coming RN aware and will update MD when called back.

## 2020-08-30 ENCOUNTER — Inpatient Hospital Stay (HOSPITAL_COMMUNITY): Payer: 59

## 2020-08-30 LAB — RESP PANEL BY RT-PCR (FLU A&B, COVID) ARPGX2
Influenza A by PCR: NEGATIVE
Influenza B by PCR: NEGATIVE
SARS Coronavirus 2 by RT PCR: NEGATIVE

## 2020-08-30 MED ORDER — HYDROCODONE-ACETAMINOPHEN 10-325 MG PO TABS
1.0000 | ORAL_TABLET | ORAL | Status: DC | PRN
Start: 2020-08-30 — End: 2020-08-31
  Administered 2020-08-30 – 2020-08-31 (×3): 1 via ORAL
  Filled 2020-08-30 (×4): qty 1

## 2020-08-30 NOTE — Progress Notes (Signed)
Physical Therapy Treatment Patient Details Name: Shelly Leblanc MRN: [id number] DOB: 09-18-1995 Today's Date: 08/30/2020    History of Present Illness Pt is a 25 y.o. female who underwent repair of CSF fistula 6/27. She originally underwent decompression for Chiari malformation 6/13. PMH: anxiety    PT Comments    Patient limited by pain this session. Patient with sensitivity to light during ambulation due to hallway lights. Educated patient and mother on progressive introduction to light with use of windows throughout the day, both verbalized understanding. Patient maintaining cervical precautions throughout mobility. Patient ambulated 150' with supervision and no AD, mild LOB noted but able to self correct. Patient negotiated 3 stairs with no hand rail and min guard, LOB noted at top of stairs but able to self correct. D/c plan continues to remain appropriate.     Follow Up Recommendations  No PT follow up;Supervision for mobility/OOB     Equipment Recommendations  None recommended by PT    Recommendations for Other Services       Precautions / Restrictions Precautions Precautions: Fall;Cervical Precaution Booklet Issued: No Precaution Comments: no bending, arching, twisting; s/p chiari malformation decompression surgery Restrictions Weight Bearing Restrictions: No    Mobility  Bed Mobility Overal bed mobility: Modified Independent                  Transfers Overall transfer level: Needs assistance Equipment used: None Transfers: Sit to/from Stand Sit to Stand: Supervision         General transfer comment: supervision for safety. Initially unsteady but able to self correct  Ambulation/Gait Ambulation/Gait assistance: Supervision Gait Distance (Feet): 150 Feet Assistive device: None Gait Pattern/deviations: Step-through pattern;Decreased stride length Gait velocity: decreased   General Gait Details: slow steady gait without RW. Minor LOB during  ambulation but able to self correct. Kept eyes closed intermittently during due to increased pain and sensitivity to lights in hallway.   Stairs Stairs: Yes Stairs assistance: Min guard Stair Management: No rails;Step to pattern;Forwards Number of Stairs: 3 General stair comments: LOB x 1 at top of stairs but able to self correct without assistance. Min guard for Administrator, arts    Modified Rankin (Stroke Patients Only)       Balance Overall balance assessment: Mild deficits observed, not formally tested                                          Cognition Arousal/Alertness: Awake/alert Behavior During Therapy: WFL for tasks assessed/performed Overall Cognitive Status: Within Functional Limits for tasks assessed                                        Exercises      General Comments        Pertinent Vitals/Pain Pain Assessment: Faces Faces Pain Scale: Hurts even more Pain Location: neck, L trap, bilateral ear drums Pain Descriptors / Indicators: Aching;Discomfort;Grimacing;Pressure;Sore Pain Intervention(s): Limited activity within patient's tolerance;Monitored during session;Repositioned    Home Living                      Prior Function            PT Goals (current goals can now be found in the care plan section) Acute Rehab PT Goals Patient  Stated Goal: to reduce pain and go home PT Goal Formulation: With patient/family Time For Goal Achievement: 09/12/20 Potential to Achieve Goals: Good Progress towards PT goals: Progressing toward goals    Frequency    Min 5X/week      PT Plan Current plan remains appropriate    Co-evaluation              AM-PAC PT "6 Clicks" Mobility   Outcome Measure  Help needed turning from your back to your side while in a flat bed without using bedrails?: None Help needed moving from lying on your back to sitting on the side of a flat bed without using  bedrails?: None Help needed moving to and from a bed to a chair (including a wheelchair)?: A Little Help needed standing up from a chair using your arms (e.g., wheelchair or bedside chair)?: A Little Help needed to walk in hospital room?: A Little Help needed climbing 3-5 steps with a railing? : A Little 6 Click Score: 20    End of Session Equipment Utilized During Treatment: Gait belt Activity Tolerance: Patient limited by pain Patient left: in bed;with call bell/phone within reach;with bed alarm set;with family/visitor present Nurse Communication: Mobility status PT Visit Diagnosis: Unsteadiness on feet (R26.81);Pain     Time: 6606-3016 PT Time Calculation (min) (ACUTE ONLY): 24 min  Charges:  $Therapeutic Activity: 23-37 mins                     Lodema Parma A. Dan Humphreys PT, DPT Acute Rehabilitation Services Pager [phone] Office [phone]    Viviann Spare 08/30/2020, 9:12 AM
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
## 2020-08-30 NOTE — Progress Notes (Signed)
Neurosurgery Service Progress Note  Subjective: No acute events overnight, headaches fairly well controlled, no drainage from the incision, still some pressure sensation in her ears / aural fullness, mild nausea, poor appetite, +flatus, no BM   Objective: Vitals:   08/30/20 0418 08/30/20 0810 08/30/20 1015 08/30/20 1117  BP: 126/74 125/82 139/82 122/79  Pulse: 86 78 93 71  Resp: 20 18 16 14   Temp: 98.5 F (36.9 C) 98.6 F (37 C) 98.5 F (36.9 C) 99.3 F (37.4 C)  TempSrc: Oral Oral Oral Oral  SpO2: 100% 100% 100% 100%  Weight:        Physical Exam: AOx3, PERRL, EOMI, FS, TM, Strength 5/5 x4, SILTx4 Incision c/d/I   Assessment & Plan: 25 y.o. woman s/p chiari decompression w/ post-op CSF leak s/p wound revision / leak repair, recovering well.  -will try hydrocodone to see if we can get better control of her pain -given that she's not currently leaking, good opportunity to get a CTH to evaluate her ventricular size given the headaches / aural fullness -if she advances her diet well and the [facility] is reassuring, likely discharge home tomorrow morning  [facility]  08/30/20 2:37 PM

## 2020-08-31 LAB — PATHOLOGIST SMEAR REVIEW: Path Review: INCREASED

## 2020-08-31 MED ORDER — HYDROCODONE-ACETAMINOPHEN 10-325 MG PO TABS: 1.0000 | ORAL_TABLET | ORAL | 0 refills | Status: DC | PRN

## 2020-08-31 MED ORDER — POLYETHYLENE GLYCOL 3350 17 G PO PACK: 17.0000 g | PACK | Freq: Every day | ORAL | 0 refills | Status: DC | PRN

## 2020-08-31 NOTE — Progress Notes (Signed)
Occupational Therapy Treatment/Discharge  Patient Details Name: Shelly Leblanc MRN: [id number] DOB: April 01, 1995 Today's Date: 08/31/2020    History of Present Illness Pt is a 25 y.o. F who presents with headaches and syncopal episodes. 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 Now s/p posterior fossa decompression for Chiari malformation 08/14/2020. Increased HA on 6/29. Repeat CT scan showing minimal increased size of ventricles since 2021. Significant PMH: anxiety.   Clinical Impression   Pt progressing towards established OT goals. Pt performing functional mobility with supervision; donning UB dressing and socks within precautions with mod I. Pt educated and performing upper body dressing, LB dressing, bed mobility, and oral care within precautions. Pt verbalizing understanding. Recommend discharge home with no follow up OT. Re-consult of change in status. OT to sign off.     Follow Up Recommendations  No OT follow up;Supervision - Intermittent    Equipment Recommendations  None recommended by OT    Recommendations for Other Services       Precautions / Restrictions Precautions Precautions: Fall;Cervical Precaution Booklet Issued: No Precaution Comments: no bending, arching, twisting; s/p chiari malformation decompression surgery. Education reviewed this session, pt verbalizing and demonstrating understanding. Restrictions Weight Bearing Restrictions: No      Mobility Bed Mobility Overal bed mobility: Modified Independent Bed Mobility: Sidelying to Sit;Sit to Sidelying   Sidelying to sit: Modified independent (Device/Increase time)     Sit to sidelying: Modified independent (Device/Increase time) General bed mobility comments: Pt educated and demonstrating use of log rolol technique.    Transfers Overall transfer level: Needs assistance Equipment used: None Transfers: Sit to/from Stand Sit to Stand: Supervision         General transfer comment: supervision  for safety.    Balance Overall balance assessment: Mild deficits observed, not formally tested                                         ADL either performed or assessed with clinical judgement   ADL Overall ADL's : Needs assistance/impaired                 Upper Body Dressing : Modified independent;Sitting Upper Body Dressing Details (indicate cue type and reason): Pt reporting she did not remember UB dressing within precautions. Pt educated and demonstrating compensatory techniques for UB dressing within precautions. Lower Body Dressing: Sit to/from stand;Supervision/safety;Modified independent Lower Body Dressing Details (indicate cue type and reason): Pt adjusting sock sitting in bed with mod I. Supervision for sit<>stand.             Functional mobility during ADLs: Supervision/safety General ADL Comments: Noting pt moving cautiously and at decreased pace during functional mobility. Supervision for safety. All precautions reviewed. Education provided on compensatory techniques for UB dressing, LB dressing, oral care, and bed mobility.     Vision   Vision Assessment?: No apparent visual deficits Additional Comments: Pt squinting and reporting slight sensitivity to light, requiring time to readjust before functional mobility in hallway.     Perception     Praxis      Pertinent Vitals/Pain Pain Assessment: Faces Faces Pain Scale: Hurts little more Pain Location: neck, L trap Pain Descriptors / Indicators: Aching;Discomfort;Grimacing;Pressure;Sore Pain Intervention(s): Limited activity within patient's tolerance;Monitored during session;Repositioned     Hand Dominance     Extremity/Trunk Assessment Upper Extremity Assessment Upper Extremity Assessment: Generalized weakness   Lower Extremity Assessment Lower  Extremity Assessment: Defer to PT evaluation       Communication     Cognition Arousal/Alertness: Awake/alert Behavior During  Therapy: [facility] for tasks assessed/performed Overall Cognitive Status: Within Functional Limits for tasks assessed                                 General Comments: Pt pleasant and conversational throuhoput session. Pt recalling location of therapy gym with no verbal cues.   General Comments  Noting pt squinting and reporting sensitivity to light. Pt educated to ask mother to bring sun glasses to wear outside at discahrge. No SOB following activity noted this session.    Exercises     Shoulder Instructions      Home Living                                          Prior Functioning/Environment                   OT Problem List:        OT Treatment/Interventions:      OT Goals(Current goals can be found in the care plan section) Acute Rehab OT Goals Patient Stated Goal: to reduce pain and go home OT Goal Formulation: With patient/family Time For Goal Achievement: 09/12/20 Potential to Achieve Goals: Good ADL Goals Additional ADL Goal #1: Pt will tolerate 5 step path finding task with mod I. Additional ADL Goal #2: Pt will tolerate 3 ADL tasks with mod I.  OT Frequency:     Barriers to D/C:            Co-evaluation              AM-PAC OT "6 Clicks" Daily Activity     Outcome Measure Help from another person eating meals?: None Help from another person taking care of personal grooming?: A Little Help from another person toileting, which includes using toliet, bedpan, or urinal?: A Little Help from another person bathing (including washing, rinsing, drying)?: A Little Help from another person to put on and taking off regular upper body clothing?: None Help from another person to put on and taking off regular lower body clothing?: A Little 6 Click Score: 20   End of Session Equipment Utilized During Treatment: Gait belt Nurse Communication: Mobility status  Activity Tolerance: Patient tolerated treatment well Patient left: in  bed;with call bell/phone within reach  OT Visit Diagnosis: Unsteadiness on feet (R26.81);Pain Pain - Right/Left: Left Pain - part of body: Shoulder (trap)                Time: 1700-1749 OT Time Calculation (min): 17 min Charges:  OT General Charges $OT Visit: 1 Visit OT Treatments $Self Care/Home Management : 8-22 mins  Ladene Artist, OTDS   Ladene Artist 08/31/2020, 10:14 AM

## 2020-08-31 NOTE — TOC Transition Note (Signed)
Transition of Care [facility]) - CM/SW Discharge Note   Patient Details  Name: Shelly Leblanc MRN: [id number] Date of Birth: [birth year]  [facility] CM/SW Contact:  Kermit Balo, RN Phone Number: 08/31/2020, 10:07 AM   Clinical Narrative:    Patient is discharging home with self care. No f/u per PT/OT and no DME needs.  Pt has transportation home.   Final next level of care: Home/Self Care Barriers to Discharge: No Barriers Identified   Patient Goals and CMS Choice        Discharge Placement                       Discharge Plan and Services                                     Social Determinants of Health (SDOH) Interventions     Readmission Risk Interventions No flowsheet data found.

## 2020-08-31 NOTE — Discharge Summary (Signed)
Discharge Summary  Date of Admission: 08/28/2020  Date of Discharge: 08/31/20  Attending Physician: Autumn Patty, MD  Hospital Course: Patient had a previous chiari decompression and presented with a CSF leak. She was taken to the OR for a wound revision without any further leakage post-op. A post-op CTH showed a very small increase in ventricular size compared to prior. She was clinically doing much better so she was discharged home. CSF and cultures were taken in the OR and negative at the time of discharge. Her hospital course was uncomplicated and the patient was discharged home on 08/31/20. She will follow up in clinic with me in 2 weeks.  Neurologic exam at discharge:  AOx3, PERRL, EOMI, FS, TM Strength 5/5 x4, SILTx4  Discharge diagnosis: Post-operative CSF leak  Jadene Pierini, MD 08/31/20 9:06 AM

## 2020-08-31 NOTE — Progress Notes (Signed)
Neurosurgery Service Progress Note  Subjective: No acute events overnight, pain and nausea better controlled, good po fluid intake, +flatus no BM yet  Objective: Vitals:   08/30/20 2124 08/30/20 2328 08/31/20 0333 08/31/20 0818  BP: 132/79 136/90 126/75 109/75  Pulse: 94 91 88 77  Resp: 19 18 16 18   Temp: 99.9 F (37.7 C) 98.9 F (37.2 C) 98.8 F (37.1 C) 98.5 F (36.9 C)  TempSrc: Oral Oral Oral Oral  SpO2: 99% 100% 100% 100%  Weight:        Physical Exam: AOx3, PERRL, EOMI, FS, TM, Strength 5/5 x4, SILTx4 Incision c/d/I   Assessment & Plan: 25 y.o. woman s/p chiari decompression w/ post-op CSF leak s/p wound revision / leak repair, recovering well. CTH w/ post-op changes, small increase in ventricular size, very small / stable temporal horns  -discharge home today  22  08/31/20 8:59 AM

## 2020-08-31 NOTE — Progress Notes (Signed)
Pt discharged at this time with father.  Has all belongings including cell phone, charger, and clothing.  Verbalizes understanding of all discharge instructions including where to pick up prescriptions and follow-up appointment.

## 2020-08-31 NOTE — Progress Notes (Signed)
Physical Therapy Treatment Patient Details Name: Shelly Leblanc MRN: [id number] DOB: December 08, 1995 Today's Date: 08/31/2020    History of Present Illness Pt is a 25 y.o. F who presents with headaches and syncopal episodes. 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 Now s/p posterior fossa decompression for Chiari malformation 08/14/2020. Increased HA on 6/29. Repeat CT scan showing minimal increased size of ventricles since 2021. Significant PMH: anxiety.    PT Comments    Patient functioning at modI for mobility requiring increased time to complete mobility tasks due to hesitancy. Patient continues to be limited by pain in neck and L trap. Educated patient on cervical precautions and able to maintain throughout mobility. All goals met and adequate for discharge. No further skilled PT needs required. No PT follow up recommended at this time.     Follow Up Recommendations  No PT follow up;Supervision for mobility/OOB     Equipment Recommendations  None recommended by PT    Recommendations for Other Services       Precautions / Restrictions Precautions Precautions: Fall;Cervical Precaution Booklet Issued: No Precaution Comments: unable to recall precautions. Re-educated patient and able to maintain throughout session Restrictions Weight Bearing Restrictions: No    Mobility  Bed Mobility Overal bed mobility: Modified Independent                  Transfers Overall transfer level: Modified independent                  Ambulation/Gait Ambulation/Gait assistance: Modified independent (Device/Increase time) Gait Distance (Feet): 250 Feet Assistive device: None Gait Pattern/deviations: Step-through pattern;Decreased stride length Gait velocity: decreased   General Gait Details: slow steady gait. LOB x 1 but able to self correct with no assistance   Stairs Stairs: Yes Stairs assistance: Modified independent (Device/Increase time) Stair Management: No  rails;Step to pattern;Forwards Number of Stairs: 2     Wheelchair Mobility    Modified Rankin (Stroke Patients Only)       Balance Overall balance assessment: Mild deficits observed, not formally tested                                          Cognition Arousal/Alertness: Awake/alert Behavior During Therapy: WFL for tasks assessed/performed Overall Cognitive Status: Within Functional Limits for tasks assessed                                        Exercises      General Comments        Pertinent Vitals/Pain Pain Assessment: Faces Faces Pain Scale: Hurts little more Pain Location: neck, L trap Pain Descriptors / Indicators: Aching;Discomfort;Grimacing;Pressure;Sore Pain Intervention(s): Limited activity within patient's tolerance    Home Living                      Prior Function            PT Goals (current goals can now be found in the care plan section) Acute Rehab PT Goals Patient Stated Goal: to reduce pain and go home PT Goal Formulation: With patient/family Time For Goal Achievement: 09/12/20 Potential to Achieve Goals: Good Progress towards PT goals: Goals met/education completed, patient discharged from PT    Frequency    Min 5X/week  PT Plan Current plan remains appropriate    Co-evaluation              AM-PAC PT "6 Clicks" Mobility   Outcome Measure  Help needed turning from your back to your side while in a flat bed without using bedrails?: None Help needed moving from lying on your back to sitting on the side of a flat bed without using bedrails?: None Help needed moving to and from a bed to a chair (including a wheelchair)?: None Help needed standing up from a chair using your arms (e.g., wheelchair or bedside chair)?: None Help needed to walk in hospital room?: None Help needed climbing 3-5 steps with a railing? : None 6 Click Score: 24    End of Session   Activity Tolerance:  Patient tolerated treatment well Patient left: in bed;with call bell/phone within reach Nurse Communication: Mobility status PT Visit Diagnosis: Unsteadiness on feet (R26.81);Pain     Time: 1130-1141 PT Time Calculation (min) (ACUTE ONLY): 11 min  Charges:  $Therapeutic Activity: 8-22 mins                     Arabela Basaldua A. Gilford Rile PT, DPT Acute Rehabilitation Services Pager [phone] Office [phone]    Linna Hoff 08/31/2020, 1:08 PM

## 2020-09-01 LAB — CSF CULTURE W GRAM STAIN: Culture: NO GROWTH

## 2020-09-04 LAB — AEROBIC/ANAEROBIC CULTURE W GRAM STAIN (SURGICAL/DEEP WOUND): Gram Stain: NONE SEEN

## 2020-09-06 ENCOUNTER — Telehealth: Payer: Self-pay

## 2020-09-06 NOTE — Telephone Encounter (Signed)
Transition Care Management Unsuccessful Follow-up Telephone Call  Date of discharge and from where:  [phone] [facility]  Attempts:  1st Attempt  Reason for unsuccessful TCM follow-up call:  Left voice message

## 2020-09-19 ENCOUNTER — Telehealth: Payer: Self-pay

## 2020-09-19 NOTE — Telephone Encounter (Signed)
Transition Care Management Unsuccessful Follow-up Telephone Call  Date of discharge and from where:  [phone] [facility]  Attempts:  2nd Attempt  Reason for unsuccessful TCM follow-up call:  Left voice message

## 2020-11-10 ENCOUNTER — Other Ambulatory Visit: Payer: Self-pay | Admitting: Neurological Surgery

## 2020-11-10 DIAGNOSIS — G935 Compression of brain: Secondary | ICD-10-CM

## 2020-11-25 ENCOUNTER — Ambulatory Visit
Admission: RE | Admit: 2020-11-25 | Discharge: 2020-11-25 | Disposition: A | Discharge status: Home / Self Care | Payer: 59 | Source: Ambulatory Visit | Attending: Neurological Surgery | Admitting: Neurological Surgery

## 2020-11-25 ENCOUNTER — Other Ambulatory Visit: Payer: Self-pay

## 2020-11-25 DIAGNOSIS — G935 Compression of brain: Secondary | ICD-10-CM

## 2020-12-03 ENCOUNTER — Inpatient Hospital Stay (HOSPITAL_COMMUNITY)
Admission: EM | Admit: 2020-12-03 | Discharge: 2020-12-08 | DRG: 908 | Disposition: A | Discharge status: Home / Self Care | Payer: 59 | Attending: Neurological Surgery | Admitting: Neurological Surgery

## 2020-12-03 ENCOUNTER — Other Ambulatory Visit: Payer: Self-pay

## 2020-12-03 ENCOUNTER — Emergency Department (HOSPITAL_COMMUNITY): Payer: 59

## 2020-12-03 ENCOUNTER — Encounter (HOSPITAL_COMMUNITY): Payer: Self-pay | Admitting: Emergency Medicine

## 2020-12-03 DIAGNOSIS — G918 Other hydrocephalus: Secondary | ICD-10-CM | POA: Diagnosis present

## 2020-12-03 DIAGNOSIS — G9600 Cerebrospinal fluid leak, unspecified: Secondary | ICD-10-CM

## 2020-12-03 DIAGNOSIS — Z8249 Family history of ischemic heart disease and other diseases of the circulatory system: Secondary | ICD-10-CM | POA: Diagnosis not present

## 2020-12-03 DIAGNOSIS — R519 Headache, unspecified: Secondary | ICD-10-CM

## 2020-12-03 DIAGNOSIS — Z833 Family history of diabetes mellitus: Secondary | ICD-10-CM

## 2020-12-03 DIAGNOSIS — F419 Anxiety disorder, unspecified: Secondary | ICD-10-CM | POA: Diagnosis not present

## 2020-12-03 DIAGNOSIS — T888XXA Other specified complications of surgical and medical care, not elsewhere classified, initial encounter: Principal | ICD-10-CM | POA: Diagnosis present

## 2020-12-03 DIAGNOSIS — G919 Hydrocephalus, unspecified: Secondary | ICD-10-CM | POA: Diagnosis present

## 2020-12-03 DIAGNOSIS — Z20822 Contact with and (suspected) exposure to covid-19: Secondary | ICD-10-CM | POA: Diagnosis present

## 2020-12-03 DIAGNOSIS — G96198 Other disorders of meninges, not elsewhere classified: Secondary | ICD-10-CM | POA: Diagnosis present

## 2020-12-03 HISTORY — DX: Compression of brain: G93.5

## 2020-12-03 LAB — CBC WITH DIFFERENTIAL/PLATELET
Abs Immature Granulocytes: 0.01 10*3/uL (ref 0.00–0.07)
Basophils Absolute: 0.1 10*3/uL (ref 0.0–0.1)
Basophils Relative: 1 %
Eosinophils Absolute: 0.1 10*3/uL (ref 0.0–0.5)
Eosinophils Relative: 1 %
HCT: 46.4 % — ABNORMAL HIGH (ref 36.0–46.0)
Hemoglobin: 14.3 g/dL (ref 12.0–15.0)
Immature Granulocytes: 0 %
Lymphocytes Relative: 40 %
Lymphs Abs: 3.3 10*3/uL (ref 0.7–4.0)
MCH: 29.9 pg (ref 26.0–34.0)
MCHC: 30.8 g/dL (ref 30.0–36.0)
MCV: 96.9 fL (ref 80.0–100.0)
Monocytes Absolute: 0.5 10*3/uL (ref 0.1–1.0)
Monocytes Relative: 6 %
Neutro Abs: 4.4 10*3/uL (ref 1.7–7.7)
Neutrophils Relative %: 52 %
Platelets: 220 10*3/uL (ref 150–400)
RBC: 4.79 MIL/uL (ref 3.87–5.11)
RDW: 12.6 % (ref 11.5–15.5)
WBC: 8.3 10*3/uL (ref 4.0–10.5)
nRBC: 0 % (ref 0.0–0.2)

## 2020-12-03 LAB — I-STAT CHEM 8, ED
BUN: 13 mg/dL (ref 6–20)
Calcium, Ion: 1.15 mmol/L (ref 1.15–1.40)
Chloride: 109 mmol/L (ref 98–111)
Creatinine, Ser: 0.8 mg/dL (ref 0.44–1.00)
Glucose, Bld: 137 mg/dL — ABNORMAL HIGH (ref 70–99)
HCT: 43 % (ref 36.0–46.0)
Hemoglobin: 14.6 g/dL (ref 12.0–15.0)
Potassium: 3.9 mmol/L (ref 3.5–5.1)
Sodium: 138 mmol/L (ref 135–145)
TCO2: 20 mmol/L — ABNORMAL LOW (ref 22–32)

## 2020-12-03 LAB — RESP PANEL BY RT-PCR (FLU A&B, COVID) ARPGX2
Influenza A by PCR: NEGATIVE
Influenza B by PCR: NEGATIVE
SARS Coronavirus 2 by RT PCR: NEGATIVE

## 2020-12-03 LAB — I-STAT BETA HCG BLOOD, ED (MC, WL, AP ONLY): I-stat hCG, quantitative: <5 m[IU]/mL (ref <5)

## 2020-12-03 LAB — MRSA NEXT GEN BY PCR, NASAL: MRSA by PCR Next Gen: NOT DETECTED

## 2020-12-03 MED ORDER — ACETAMINOPHEN 500 MG PO TABS
1000.0000 mg | ORAL_TABLET | Freq: Once | ORAL | Status: AC
  Administered 2020-12-03: 1000 mg via ORAL
  Filled 2020-12-03: qty 2

## 2020-12-03 MED ORDER — POLYETHYLENE GLYCOL 3350 17 G PO PACK: 17.0000 g | PACK | Freq: Every day | ORAL | Status: DC | PRN

## 2020-12-03 MED ORDER — ACETAMINOPHEN 650 MG RE SUPP: 650.0000 mg | Freq: Four times a day (QID) | RECTAL | Status: DC | PRN

## 2020-12-03 MED ORDER — ACETAZOLAMIDE ER 500 MG PO CP12
500.0000 mg | ORAL_CAPSULE | Freq: Every day | ORAL | Status: DC
  Filled 2020-12-03 (×3): qty 1

## 2020-12-03 MED ORDER — ACETAMINOPHEN 325 MG PO TABS
650.0000 mg | ORAL_TABLET | Freq: Four times a day (QID) | ORAL | Status: DC | PRN
  Administered 2020-12-03 – 2020-12-08 (×6): 650 mg via ORAL
  Filled 2020-12-03 (×6): qty 2

## 2020-12-03 MED ORDER — OXYCODONE HCL 5 MG PO TABS
5.0000 mg | ORAL_TABLET | ORAL | Status: DC | PRN
  Administered 2020-12-07 (×3): 5 mg via ORAL
  Filled 2020-12-03 (×4): qty 1

## 2020-12-03 MED ORDER — FLEET ENEMA 7-19 GM/118ML RE ENEM: 1.0000 | ENEMA | Freq: Once | RECTAL | Status: DC | PRN

## 2020-12-03 MED ORDER — DOCUSATE SODIUM 100 MG PO CAPS
100.0000 mg | ORAL_CAPSULE | Freq: Two times a day (BID) | ORAL | Status: DC
Start: 2020-12-03 — End: 2020-12-08
  Administered 2020-12-03 – 2020-12-08 (×9): 100 mg via ORAL
  Filled 2020-12-03 (×9): qty 1

## 2020-12-03 MED ORDER — SODIUM CHLORIDE 0.9% FLUSH: 3.0000 mL | INTRAVENOUS | Status: DC | PRN

## 2020-12-03 MED ORDER — BISACODYL 10 MG RE SUPP: 10.0000 mg | Freq: Every day | RECTAL | Status: DC | PRN

## 2020-12-03 MED ORDER — SODIUM CHLORIDE 0.9 % IV BOLUS
1000.0000 mL | Freq: Once | INTRAVENOUS | Status: AC
  Administered 2020-12-03: 1000 mL via INTRAVENOUS

## 2020-12-03 MED ORDER — METHOCARBAMOL 1000 MG/10ML IJ SOLN
500.0000 mg | Freq: Four times a day (QID) | INTRAVENOUS | Status: DC | PRN
  Filled 2020-12-03: qty 5

## 2020-12-03 MED ORDER — HYDROMORPHONE HCL 1 MG/ML IJ SOLN
0.5000 mg | INTRAMUSCULAR | Status: DC | PRN
  Administered 2020-12-07 – 2020-12-08 (×3): 1 mg via INTRAVENOUS
  Filled 2020-12-03 (×4): qty 1

## 2020-12-03 MED ORDER — ONDANSETRON HCL 4 MG PO TABS: 4.0000 mg | ORAL_TABLET | Freq: Four times a day (QID) | ORAL | Status: DC | PRN

## 2020-12-03 MED ORDER — SODIUM CHLORIDE 0.9% FLUSH
3.0000 mL | Freq: Two times a day (BID) | INTRAVENOUS | Status: DC
  Administered 2020-12-03 – 2020-12-08 (×9): 3 mL via INTRAVENOUS

## 2020-12-03 MED ORDER — SODIUM CHLORIDE 0.9 % IV SOLN: 250.0000 mL | INTRAVENOUS | Status: DC | PRN

## 2020-12-03 MED ORDER — ONDANSETRON HCL 4 MG/2ML IJ SOLN
4.0000 mg | Freq: Four times a day (QID) | INTRAMUSCULAR | Status: DC | PRN
  Filled 2020-12-03: qty 2

## 2020-12-03 NOTE — ED Notes (Signed)
Called [facility] at [phone] to request transport to [facility] for neurosurgery admission.

## 2020-12-03 NOTE — Plan of Care (Signed)
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
## 2020-12-03 NOTE — ED Triage Notes (Signed)
Patient arrives complaining of a headache, temors, and fast heart rate. Patient has a chiari malformation repair back in June. Patient states she has a panic attack, and feels like she can't come down from it, when she can normally, "walk it off." Patient noted to have a tremor on assessment.

## 2020-12-03 NOTE — ED Notes (Signed)
Attempted report to [facility]. Informed charge nurse taking patient off floor and did not know who would be getting pt. Left callback #.

## 2020-12-04 ENCOUNTER — Observation Stay (HOSPITAL_COMMUNITY): Payer: 59

## 2020-12-04 DIAGNOSIS — G919 Hydrocephalus, unspecified: Secondary | ICD-10-CM | POA: Diagnosis present

## 2020-12-04 DIAGNOSIS — Z833 Family history of diabetes mellitus: Secondary | ICD-10-CM | POA: Diagnosis not present

## 2020-12-04 DIAGNOSIS — G918 Other hydrocephalus: Secondary | ICD-10-CM | POA: Diagnosis present

## 2020-12-04 DIAGNOSIS — F419 Anxiety disorder, unspecified: Secondary | ICD-10-CM | POA: Diagnosis present

## 2020-12-04 DIAGNOSIS — Z20822 Contact with and (suspected) exposure to covid-19: Secondary | ICD-10-CM | POA: Diagnosis present

## 2020-12-04 DIAGNOSIS — T888XXA Other specified complications of surgical and medical care, not elsewhere classified, initial encounter: Secondary | ICD-10-CM | POA: Diagnosis present

## 2020-12-04 DIAGNOSIS — G96198 Other disorders of meninges, not elsewhere classified: Secondary | ICD-10-CM | POA: Diagnosis present

## 2020-12-04 DIAGNOSIS — Z8249 Family history of ischemic heart disease and other diseases of the circulatory system: Secondary | ICD-10-CM | POA: Diagnosis not present

## 2020-12-04 LAB — BASIC METABOLIC PANEL
Anion gap: 8 (ref 5–15)
BUN: 14 mg/dL (ref 6–20)
CO2: 18 mmol/L — ABNORMAL LOW (ref 22–32)
Calcium: 8.8 mg/dL — ABNORMAL LOW (ref 8.9–10.3)
Chloride: 111 mmol/L (ref 98–111)
Creatinine, Ser: 0.87 mg/dL (ref 0.44–1.00)
GFR, Estimated: >60 mL/min (ref >60)
Glucose, Bld: 103 mg/dL — ABNORMAL HIGH (ref 70–99)
Potassium: 3.9 mmol/L (ref 3.5–5.1)
Sodium: 137 mmol/L (ref 135–145)

## 2020-12-04 LAB — CBC
HCT: 36.6 % (ref 36.0–46.0)
Hemoglobin: 12 g/dL (ref 12.0–15.0)
MCH: 29.4 pg (ref 26.0–34.0)
MCHC: 32.8 g/dL (ref 30.0–36.0)
MCV: 89.7 fL (ref 80.0–100.0)
Platelets: 291 10*3/uL (ref 150–400)
RBC: 4.08 MIL/uL (ref 3.87–5.11)
RDW: 12.5 % (ref 11.5–15.5)
WBC: 8.2 10*3/uL (ref 4.0–10.5)
nRBC: 0 % (ref 0.0–0.2)

## 2020-12-04 MED ORDER — SODIUM CHLORIDE 0.9 % IV SOLN: INTRAVENOUS | Status: DC

## 2020-12-04 NOTE — Plan of Care (Signed)
  Problem: Education: Goal: Knowledge of General Education information will improve Description: Including pain rating scale, medication(s)/side effects and non-pharmacologic comfort measures Outcome: Progressing   Problem: Health Behavior/Discharge Planning: Goal: Ability to manage health-related needs will improve Outcome: Progressing   Problem: Clinical Measurements: Goal: Ability to maintain clinical measurements within normal limits will improve Outcome: Progressing Goal: Will remain free from infection Outcome: Progressing Goal: Diagnostic test results will improve Outcome: Progressing Goal: Respiratory complications will improve Outcome: Progressing Goal: Cardiovascular complication will be avoided Outcome: Progressing   Problem: Education: Goal: Knowledge of General Education information will improve Description: Including pain rating scale, medication(s)/side effects and non-pharmacologic comfort measures Outcome: Progressing   Problem: Elimination: Goal: Will not experience complications related to bowel motility Outcome: Progressing Goal: Will not experience complications related to urinary retention Outcome: Progressing   Problem: Pain Managment: Goal: General experience of comfort will improve Outcome: Progressing   Problem: Safety: Goal: Ability to remain free from injury will improve Outcome: Progressing   Problem: Skin Integrity: Goal: Risk for impaired skin integrity will decrease Outcome: Progressing
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
## 2020-12-05 MED ORDER — METOCLOPRAMIDE HCL 5 MG/ML IJ SOLN
10.0000 mg | Freq: Three times a day (TID) | INTRAMUSCULAR | Status: DC | PRN
  Administered 2020-12-05: 10 mg via INTRAVENOUS
  Filled 2020-12-05: qty 2

## 2020-12-05 NOTE — Progress Notes (Signed)
Neurosurgery Service Progress Note  Subjective: No acute events overnight, still having headaches, no emesis, some mild nausea, no visual changes   Objective: Vitals:   12/04/20 2015 12/05/20 0025 12/05/20 0402 12/05/20 0757  BP: 126/74 129/86 108/63 134/88  Pulse: (!) 103 (!) 101 95 96  Resp: 18 18 18 16   Temp: 98.1 F (36.7 C) 98.4 F (36.9 C) 98 F (36.7 C) 99.1 F (37.3 C)  TempSrc: Oral Oral  Oral  SpO2: 100% 100% 99% 100%  Weight:      Height:        Physical Exam: AOx3, PERRL, EOMI, FS, TM, Strength 5/5 x4, SILTx4, no drift  Assessment & Plan: 25 y.o. woman s/p chiari decompression w/ post-op hydrocephalus.  -will have to move case to tomorrow due to equipment availability of Brainlab for catheter placement -on schedule for tomorrow 10/5 afternoon -NPO p MN tonight, regular diet now, can hold IV fluids until midnight -preop CT done and good quality  25  12/05/20 8:10 AM

## 2020-12-05 NOTE — Progress Notes (Signed)
Pt complaining of pressure in her sinuses and on the left side of her posterior neck.  Offered claritin but patient refused.  She denies any pain, nausea, or dizziness, but did request tylenol and that I ask MD for cold medicine.  Call to office placed at this time and spoke with Morrie Sheldon who stated she will get the message to Dr. Maurice Small.  Pt did ambulate around unit with no problem.

## 2020-12-06 ENCOUNTER — Inpatient Hospital Stay (HOSPITAL_COMMUNITY): Payer: 59 | Admitting: Anesthesiology

## 2020-12-06 ENCOUNTER — Encounter (HOSPITAL_COMMUNITY)
Admission: EM | Disposition: A | Discharge status: Home / Self Care | Payer: Self-pay | Source: Home / Self Care | Attending: Neurological Surgery

## 2020-12-06 ENCOUNTER — Encounter (HOSPITAL_COMMUNITY): Payer: Self-pay | Admitting: Neurological Surgery

## 2020-12-06 HISTORY — PX: APPLICATION OF CRANIAL NAVIGATION: SHX6578

## 2020-12-06 HISTORY — PX: VENTRICULOPERITONEAL SHUNT: SHX204

## 2020-12-06 SURGERY — SHUNT INSERTION VENTRICULAR-PERITONEAL
Anesthesia: General | Site: Head | Laterality: Right

## 2020-12-06 MED ORDER — BACITRACIN ZINC 500 UNIT/GM EX OINT
TOPICAL_OINTMENT | CUTANEOUS | Status: AC
  Filled 2020-12-06: qty 28.35

## 2020-12-06 MED ORDER — LIDOCAINE 2% (20 MG/ML) 5 ML SYRINGE
INTRAMUSCULAR | Status: DC | PRN
  Administered 2020-12-06: 30 mg via INTRAVENOUS

## 2020-12-06 MED ORDER — THROMBIN 5000 UNITS EX SOLR
OROMUCOSAL | Status: DC | PRN
  Administered 2020-12-06: 5 mL via TOPICAL

## 2020-12-06 MED ORDER — ROCURONIUM BROMIDE 10 MG/ML (PF) SYRINGE
PREFILLED_SYRINGE | INTRAVENOUS | Status: DC | PRN
  Administered 2020-12-06: 50 mg via INTRAVENOUS
  Administered 2020-12-06: 10 mg via INTRAVENOUS
  Administered 2020-12-06: 20 mg via INTRAVENOUS

## 2020-12-06 MED ORDER — DEXAMETHASONE SODIUM PHOSPHATE 10 MG/ML IJ SOLN
INTRAMUSCULAR | Status: DC | PRN
  Administered 2020-12-06: 10 mg via INTRAVENOUS

## 2020-12-06 MED ORDER — CHLORHEXIDINE GLUCONATE 0.12 % MT SOLN
15.0000 mL | Freq: Once | OROMUCOSAL | Status: DC
  Filled 2020-12-06: qty 15

## 2020-12-06 MED ORDER — PROPOFOL 10 MG/ML IV BOLUS
INTRAVENOUS | Status: DC | PRN
  Administered 2020-12-06: 150 mg via INTRAVENOUS
  Administered 2020-12-06: 75 mg via INTRAVENOUS

## 2020-12-06 MED ORDER — BACITRACIN ZINC 500 UNIT/GM EX OINT
TOPICAL_OINTMENT | CUTANEOUS | Status: DC | PRN
  Administered 2020-12-06: 1 via TOPICAL

## 2020-12-06 MED ORDER — ONDANSETRON HCL 4 MG/2ML IJ SOLN
INTRAMUSCULAR | Status: DC | PRN
  Administered 2020-12-06: 4 mg via INTRAVENOUS

## 2020-12-06 MED ORDER — CEFAZOLIN SODIUM-DEXTROSE 2-4 GM/100ML-% IV SOLN
INTRAVENOUS | Status: AC
  Filled 2020-12-06: qty 100

## 2020-12-06 MED ORDER — ONDANSETRON HCL 4 MG/2ML IJ SOLN
INTRAMUSCULAR | Status: AC
  Filled 2020-12-06: qty 2

## 2020-12-06 MED ORDER — ORAL CARE MOUTH RINSE: 15.0000 mL | Freq: Once | OROMUCOSAL | Status: DC

## 2020-12-06 MED ORDER — CEFAZOLIN SODIUM-DEXTROSE 2-3 GM-%(50ML) IV SOLR
INTRAVENOUS | Status: DC | PRN
  Administered 2020-12-06: 2 g via INTRAVENOUS

## 2020-12-06 MED ORDER — SUGAMMADEX SODIUM 200 MG/2ML IV SOLN
INTRAVENOUS | Status: DC | PRN
  Administered 2020-12-06: 200 mg via INTRAVENOUS

## 2020-12-06 MED ORDER — FENTANYL CITRATE (PF) 100 MCG/2ML IJ SOLN
INTRAMUSCULAR | Status: AC
  Filled 2020-12-06: qty 2

## 2020-12-06 MED ORDER — MIDAZOLAM HCL 5 MG/5ML IJ SOLN
INTRAMUSCULAR | Status: DC | PRN
  Administered 2020-12-06: 2 mg via INTRAVENOUS

## 2020-12-06 MED ORDER — PROPOFOL 10 MG/ML IV BOLUS
INTRAVENOUS | Status: AC
  Filled 2020-12-06: qty 20

## 2020-12-06 MED ORDER — SODIUM CHLORIDE 0.9 % IV SOLN: INTRAVENOUS | Status: DC

## 2020-12-06 MED ORDER — THROMBIN 5000 UNITS EX SOLR
CUTANEOUS | Status: AC
  Filled 2020-12-06: qty 5000

## 2020-12-06 MED ORDER — DEXAMETHASONE SODIUM PHOSPHATE 10 MG/ML IJ SOLN
INTRAMUSCULAR | Status: AC
  Filled 2020-12-06: qty 1

## 2020-12-06 MED ORDER — LIDOCAINE-EPINEPHRINE 1 %-1:100000 IJ SOLN
INTRAMUSCULAR | Status: DC | PRN
  Administered 2020-12-06: 10 mL via INTRADERMAL

## 2020-12-06 MED ORDER — FENTANYL CITRATE (PF) 100 MCG/2ML IJ SOLN: INTRAMUSCULAR | Status: DC | PRN

## 2020-12-06 MED ORDER — MIDAZOLAM HCL 2 MG/2ML IJ SOLN
INTRAMUSCULAR | Status: AC
  Filled 2020-12-06: qty 2

## 2020-12-06 MED ORDER — FENTANYL CITRATE (PF) 100 MCG/2ML IJ SOLN
25.0000 ug | INTRAMUSCULAR | Status: DC | PRN
  Administered 2020-12-06 (×2): 50 ug via INTRAVENOUS

## 2020-12-06 MED ORDER — FENTANYL CITRATE (PF) 250 MCG/5ML IJ SOLN
INTRAMUSCULAR | Status: DC | PRN
  Administered 2020-12-06: 150 ug via INTRAVENOUS
  Administered 2020-12-06: 100 ug via INTRAVENOUS

## 2020-12-06 MED ORDER — FENTANYL CITRATE (PF) 250 MCG/5ML IJ SOLN
INTRAMUSCULAR | Status: AC
  Filled 2020-12-06: qty 5

## 2020-12-06 MED ORDER — 0.9 % SODIUM CHLORIDE (POUR BTL) OPTIME
TOPICAL | Status: DC | PRN
  Administered 2020-12-06: 1000 mL

## 2020-12-06 MED ORDER — LIDOCAINE-EPINEPHRINE 1 %-1:100000 IJ SOLN
INTRAMUSCULAR | Status: AC
  Filled 2020-12-06: qty 1

## 2020-12-06 SURGICAL SUPPLY — 57 items
BAG COUNTER SPONGE SURGICOUNT (BAG) ×6 IMPLANT
BLADE CLIPPER SURG (BLADE) ×3 IMPLANT
BLADE SURG 11 STRL SS (BLADE) IMPLANT
BOOT SUTURE AID YELLOW STND (SUTURE) ×3 IMPLANT
BUR ACORN 9.0 PRECISION (BURR) ×3 IMPLANT
CANISTER SUCT 3000ML PPV (MISCELLANEOUS) ×3 IMPLANT
CLIP RANEY DISP (INSTRUMENTS) ×3 IMPLANT
DECANTER SPIKE VIAL GLASS SM (MISCELLANEOUS) ×3 IMPLANT
DERMABOND ADVANCED (GAUZE/BANDAGES/DRESSINGS) ×1
DERMABOND ADVANCED .7 DNX12 (GAUZE/BANDAGES/DRESSINGS) ×2 IMPLANT
DRAPE HALF SHEET 40X57 (DRAPES) ×3 IMPLANT
DRAPE INCISE IOBAN 66X45 STRL (DRAPES) ×3 IMPLANT
DRAPE ORTHO SPLIT 77X108 STRL (DRAPES) ×6
DRAPE SURG ORHT 6 SPLT 77X108 (DRAPES) ×4 IMPLANT
DURAPREP 26ML APPLICATOR (WOUND CARE) ×6 IMPLANT
ELECT REM PT RETURN 9FT ADLT (ELECTROSURGICAL) ×3
ELECTRODE REM PT RTRN 9FT ADLT (ELECTROSURGICAL) ×2 IMPLANT
GAUZE 4X4 16PLY ~~LOC~~+RFID DBL (SPONGE) ×3 IMPLANT
GLOVE EXAM NITRILE XL STR (GLOVE) IMPLANT
GLOVE SURG LTX SZ6.5 (GLOVE) ×3 IMPLANT
GLOVE SURG LTX SZ7.5 (GLOVE) ×6 IMPLANT
GLOVE SURG UNDER LTX SZ7.5 (GLOVE) ×3 IMPLANT
GLOVE SURG UNDER POLY LF SZ7 (GLOVE) ×3 IMPLANT
GOWN STRL REUS W/ TWL LRG LVL3 (GOWN DISPOSABLE) ×4 IMPLANT
GOWN STRL REUS W/ TWL XL LVL3 (GOWN DISPOSABLE) IMPLANT
GOWN STRL REUS W/TWL 2XL LVL3 (GOWN DISPOSABLE) IMPLANT
GOWN STRL REUS W/TWL LRG LVL3 (GOWN DISPOSABLE) ×6
GOWN STRL REUS W/TWL XL LVL3 (GOWN DISPOSABLE)
HEMOSTAT POWDER KIT SURGIFOAM (HEMOSTASIS) ×3 IMPLANT
HEMOSTAT SURGICEL 2X14 (HEMOSTASIS) IMPLANT
KIT BASIN OR (CUSTOM PROCEDURE TRAY) ×3 IMPLANT
KIT TURNOVER KIT B (KITS) ×3 IMPLANT
MARKER SKIN DUAL TIP RULER LAB (MISCELLANEOUS) IMPLANT
MARKER SPHERE PSV REFLC 13MM (MARKER) ×6 IMPLANT
NEEDLE HYPO 22GX1.5 SAFETY (NEEDLE) ×3 IMPLANT
NS IRRIG 1000ML POUR BTL (IV SOLUTION) ×3 IMPLANT
PACK LAMINECTOMY NEURO (CUSTOM PROCEDURE TRAY) ×3 IMPLANT
PAD ARMBOARD 7.5X6 YLW CONV (MISCELLANEOUS) ×3 IMPLANT
PASSER CATH 65CM DISP (NEUROSURGERY SUPPLIES) IMPLANT
SHEATH PERITONEAL INTRO 61 (SHEATH) ×3 IMPLANT
SPONGE T-LAP 4X18 ~~LOC~~+RFID (SPONGE) ×3 IMPLANT
STAPLER SKIN PROX WIDE 3.9 (STAPLE) ×3 IMPLANT
STRIP CLOSURE SKIN 1/2X4 (GAUZE/BANDAGES/DRESSINGS) IMPLANT
SUT ETHILON 3 0 FSL (SUTURE) IMPLANT
SUT MNCRL AB 3-0 PS2 18 (SUTURE) ×6 IMPLANT
SUT NURALON 4 0 TR CR/8 (SUTURE) ×3 IMPLANT
SUT SILK 0 TIES 10X30 (SUTURE) IMPLANT
SUT SILK 3 0 SH 30 (SUTURE) IMPLANT
SUT VIC AB 2-0 CP2 18 (SUTURE) ×6 IMPLANT
SUT VIC AB 3-0 SH 8-18 (SUTURE) ×6 IMPLANT
SYR CONTROL 10ML LL (SYRINGE) ×3 IMPLANT
TOWEL GREEN STERILE (TOWEL DISPOSABLE) ×6 IMPLANT
TOWEL GREEN STERILE FF (TOWEL DISPOSABLE) ×3 IMPLANT
TUBE CONNECTING 12X1/4 (SUCTIONS) IMPLANT
UNDERPAD 30X36 HEAVY ABSORB (UNDERPADS AND DIAPERS) ×3 IMPLANT
VALVE PROGRAM CERTAS SM ANTI (Valve) ×3 IMPLANT
WATER STERILE IRR 1000ML POUR (IV SOLUTION) ×3 IMPLANT

## 2020-12-06 NOTE — Transfer of Care (Signed)
Immediate Anesthesia Transfer of Care Note  Patient: Michiko L Nealy  Procedure(s) Performed: RIGHT VENTRICULOPERITONEAL SHUNT PLACEMENT WITH BRAINLAB (Right: Head) APPLICATION OF CRANIAL NAVIGATION  Patient Location: PACU  Anesthesia Type:General  Level of Consciousness: awake, alert  and oriented  Airway & Oxygen Therapy: Patient Spontanous Breathing  Post-op Assessment: Report given to RN  Post vital signs: Reviewed and stable  Last Vitals:  Vitals Value Taken Time  BP    Temp    Pulse 111 12/06/20 2243  Resp 17 12/06/20 2243  SpO2 100 % 12/06/20 2243  Vitals shown include unvalidated device data.  Last Pain:  Vitals:   12/06/20 1943  TempSrc: Oral  PainSc: 5       Patients Stated Pain Goal: 1 (12/03/20 1612)  Complications: No notable events documented.

## 2020-12-06 NOTE — Progress Notes (Signed)
Pt came back from [facility] and oriented c/o of pain with a rating of 8, settled in bed with call light and family at bedside, incision sites dry and intact, pt reassured and will continue to monitor, v/s stable. Obasogie-Asidi, Joshuah Minella Efe

## 2020-12-06 NOTE — Anesthesia Postprocedure Evaluation (Signed)
Anesthesia Post Note  Patient: Psychologist, occupational  Procedure(s) Performed: RIGHT VENTRICULOPERITONEAL SHUNT PLACEMENT WITH BRAINLAB (Right: Head) APPLICATION OF CRANIAL NAVIGATION     Patient location during evaluation: PACU Anesthesia Type: General Level of consciousness: awake Pain management: pain level controlled Vital Signs Assessment: post-procedure vital signs reviewed and stable Respiratory status: spontaneous breathing Cardiovascular status: stable Postop Assessment: no apparent nausea or vomiting Anesthetic complications: no   No notable events documented.  Last Vitals:  Vitals:   12/06/20 2315 12/06/20 2341  BP: 140/90 140/90  Pulse: 79 83  Resp: 13 16  Temp: (!) 36.4 C 37.3 C  SpO2: 100% 100%    Last Pain:  Vitals:   12/06/20 2341  TempSrc: Oral  PainSc: 8                  Gloriajean Okun

## 2020-12-06 NOTE — Anesthesia Preprocedure Evaluation (Signed)
Anesthesia Evaluation  Patient identified by MRN, date of birth, ID band Patient awake    Reviewed: Allergy & Precautions, NPO status , Patient's Chart, lab work & pertinent test results  Airway Mallampati: II  TM Distance: >3 FB     Dental   Pulmonary    breath sounds clear to auscultation       Cardiovascular negative cardio ROS   Rhythm:Regular Rate:Normal     Neuro/Psych  Headaches, Anxiety History noted. Dr. Chilton Si    GI/Hepatic negative GI ROS, Neg liver ROS,   Endo/Other    Renal/GU negative Renal ROS     Musculoskeletal   Abdominal   Peds  Hematology   Anesthesia Other Findings   Reproductive/Obstetrics                             Anesthesia Physical Anesthesia Plan  ASA: 3  Anesthesia Plan: General   Post-op Pain Management:    Induction: Intravenous  PONV Risk Score and Plan: Ondansetron and Dexamethasone  Airway Management Planned: Oral ETT  Additional Equipment:   Intra-op Plan:   Post-operative Plan: Extubation in OR  Informed Consent: I have reviewed the patients History and Physical, chart, labs and discussed the procedure including the risks, benefits and alternatives for the proposed anesthesia with the patient or authorized representative who has indicated his/her understanding and acceptance.     Dental advisory given  Plan Discussed with: CRNA and Anesthesiologist  Anesthesia Plan Comments:         Anesthesia Quick Evaluation

## 2020-12-06 NOTE — Progress Notes (Addendum)
Pt waiting to go for surgery, PACU Staff came to get pt, report was earlier called to OR RN during day shift, pt and family at bedside reassured. Shelly Leblanc, Eleena Grater Efe

## 2020-12-06 NOTE — Anesthesia Procedure Notes (Signed)
Procedure Name: Intubation Date/Time: 12/06/2020 8:24 PM Performed by: Eligha Bridegroom, CRNA Pre-anesthesia Checklist: Patient identified, Emergency Drugs available, Suction available, Patient being monitored and Timeout performed Patient Re-evaluated:Patient Re-evaluated prior to induction Oxygen Delivery Method: Circle system utilized Preoxygenation: Pre-oxygenation with 100% oxygen Induction Type: IV induction Laryngoscope Size: Mac and 3 Grade View: Grade I Tube type: Oral Tube size: 7.0 mm Number of attempts: 1 Airway Equipment and Method: Stylet Placement Confirmation: ETT inserted through vocal cords under direct vision, positive ETCO2 and breath sounds checked- equal and bilateral Secured at: 21 cm Tube secured with: Tape Dental Injury: Teeth and Oropharynx as per pre-operative assessment

## 2020-12-06 NOTE — Op Note (Signed)
PATIENT: Hospital doctor L Boven  DAY OF SURGERY: 12/06/20   PRE-OPERATIVE DIAGNOSIS:  Chiari malformation, hydrocephalus   POST-OPERATIVE DIAGNOSIS:  Same   PROCEDURE:  Right ventriculoperitoneal shunt placement, use of frameless stereotactic navigation   SURGEON:  Surgeon(s) and Role:    Jadene Pierini, MD - Primary   ANESTHESIA: ETGA   BRIEF HISTORY: This is a 25 year old woman in whom I previously performed a chiari decompression. Post-op, she had a CSF leak that was repaired, but continued to have a large tense pseudomeningocele with severe headaches and nausea, concerning for hydrocephalus. Her symptoms persisted despite medications and allowing time for post-op inflammation to decrease, I therefore recommended VP shunt placement. Given her small ventricles, I thought this was best performed with stereotactic navigation for the proximal catheter insertion. This was discussed with the patient as well as risks, benefits, and alternatives and wished to proceed with surgery.   OPERATIVE DETAIL: The patient was taken to the operating room and placed on the OR table in the supine position. A formal time out was performed with two patient identifiers and confirmed the operative site. Anesthesia was induced by the anesthesia team. The Mayfield head holder was applied toa allow for frameless stereotactic navigation. A reference array was attached to the Mayfield and the patient's preop scan was loaded onto the brainlab workstation then registered with good fit.   The head was turned contralaterally and a bump was placed under the ipsilateral shoulder to assist with tunneling. The operative sites were marked, hair was clipped with surgical clippers, and the area was then prepped and draped in a sterile fashion along the entire length of the shunt system. Stereotaxy was used to help plan the proximal incision and trajectory. A curvilinear incision was placed on the right scalp in the parieto-occipital  region. A burr hole was placed, dura coagulated, and a ventricular catheter was inserted using frameless stereotaxy with good flow of CSF. This was secured and attention was turned to the distal system.  Using a tunneler, the distal catheter was then passed subcutaneously from the scalp incision to the previously marked abdominal incision. The abdominal incision was opened and a small laparotomy was created. Intra-peritoneal location was confirmed by irrigation as well as passing a Penfield #4 across midline. The shunt system was flushed, valve was connected with attention to flow orientation, then connected to the proximal catheter with 2-0 silk ties at each connection point. After spontaneous flow was seen through the distal catheter, it was then placed intraperitoneal without resistance. With the shunt system in place and functioning, all incisions were copiously irrigated and then closed in layers. All instrument and sponge counts were correct. The patient was then returned to anesthesia for emergence. No apparent complications at the completion of the procedure.  IMPLANTS: Certas shunt valve, set to performance level 3.0   EBL:  46mL   DRAINS: none   SPECIMENS: none   Jadene Pierini, MD 12/06/20 8:00 PM

## 2020-12-06 NOTE — Progress Notes (Signed)
Neurosurgery Service Progress Note  Subjective: No acute events overnight, HA continue, no new complaints  Objective: Vitals:   12/05/20 2006 12/05/20 2300 12/06/20 0355 12/06/20 0816  BP: 126/83 134/83 120/70 139/88  Pulse: 95 96 (!) 101 95  Resp: 18 18 19 14   Temp: 99 F (37.2 C) 98.9 F (37.2 C) 98.7 F (37.1 C) 98.4 F (36.9 C)  TempSrc: Oral Oral Oral Oral  SpO2: 100% 100% 99% 100%  Weight:      Height:        Physical Exam: AOx3, PERRL, EOMI, FS, TM, Strength 5/5 x4, SILTx4  Assessment & Plan: 25 y.o. woman s/p chiari decompression w/ post-op hydrocephalus.  -OR today for VPS placement  22  12/06/20 8:16 AM

## 2020-12-07 ENCOUNTER — Encounter (HOSPITAL_COMMUNITY): Payer: Self-pay | Admitting: Neurological Surgery

## 2020-12-07 MED ORDER — PHENOL 1.4 % MT LIQD: 1.0000 | OROMUCOSAL | Status: DC | PRN

## 2020-12-07 MED ORDER — OXYCODONE HCL 5 MG PO TABS: 5.0000 mg | ORAL_TABLET | ORAL | 0 refills | Status: DC | PRN

## 2020-12-07 MED ORDER — PHENOL 1.4 % MT LIQD
1.0000 | OROMUCOSAL | Status: DC | PRN
  Administered 2020-12-07: 1 via OROMUCOSAL
  Filled 2020-12-07: qty 177

## 2020-12-07 NOTE — Plan of Care (Signed)
  Problem: Education: Goal: Knowledge of General Education information will improve Description: Including pain rating scale, medication(s)/side effects and non-pharmacologic comfort measures Outcome: Adequate for Discharge   Problem: Health Behavior/Discharge Planning: Goal: Ability to manage health-related needs will improve Outcome: Adequate for Discharge   Problem: Clinical Measurements: Goal: Ability to maintain clinical measurements within normal limits will improve Outcome: Adequate for Discharge Goal: Will remain free from infection Outcome: Adequate for Discharge Goal: Diagnostic test results will improve Outcome: Adequate for Discharge Goal: Respiratory complications will improve Outcome: Adequate for Discharge Goal: Cardiovascular complication will be avoided Outcome: Adequate for Discharge   Problem: Nutrition: Goal: Adequate nutrition will be maintained Outcome: Adequate for Discharge   Problem: Coping: Goal: Level of anxiety will decrease Outcome: Adequate for Discharge   Problem: Elimination: Goal: Will not experience complications related to bowel motility Outcome: Adequate for Discharge Goal: Will not experience complications related to urinary retention Outcome: Adequate for Discharge   Problem: Safety: Goal: Ability to remain free from injury will improve Outcome: Adequate for Discharge   Problem: Pain Managment: Goal: General experience of comfort will improve Outcome: Adequate for Discharge

## 2020-12-07 NOTE — Progress Notes (Signed)
Neurosurgery Service Progress Note  Subjective: No acute events overnight, headache resolved post-op, just c/o sore throat and some incisional pain  Objective: Vitals:   12/06/20 2300 12/06/20 2315 12/06/20 2341 12/07/20 0351  BP: (!) 139/91 140/90 140/90 117/74  Pulse: 88 79 83 67  Resp: 15 13 16 18   Temp:  (!) 97.5 F (36.4 C) 99.2 F (37.3 C) 98.2 F (36.8 C)  TempSrc:   Oral Oral  SpO2: 100% 100% 100% 97%  Weight:      Height:        Physical Exam: AOx3, PERRL, EOMI, FS, TM, Strength 5/5 x4, SILTx4  Assessment & Plan: 25 y.o. woman s/p chiari decompression w/ post-op hydrocephalus.  -can be discharged home later today if she feels up to it once she mobilizes -chloroseptic spray for throat  22  12/07/20 8:36 AM

## 2020-12-07 NOTE — Progress Notes (Signed)
Patient states she is not able to go home today. MD aware.

## 2020-12-07 NOTE — Discharge Summary (Signed)
Discharge Summary  Date of Admission: 12/03/2020  Date of Discharge: 12/07/20  Attending Physician: Autumn Patty, MD  Hospital Course: Patient was admitted with severe headaches and nausea consistent with hydrocephalus. She was taken to the OR on 10/5 for an uncomplicated right VP shunt. She was recovered in PACU and transferred to 3W. Her hospital course was uncomplicated and the patient was discharged home on 12/07/20. She will follow up in clinic with me in 2 weeks.  Neurologic exam at discharge:  AOx3, PERRL, EOMI, FS, TM Strength 5/5 x4, SILTx4, no drift  Discharge diagnosis: Hydrocephalus  Jadene Pierini, MD 12/07/20 8:37 AM

## 2020-12-07 NOTE — Plan of Care (Signed)

## 2020-12-07 NOTE — TOC Transition Note (Signed)
Transition of Care [facility]) - CM/SW Discharge Note   Patient Details  Name: Shelly Leblanc MRN: [id number] Date of Birth: October 13, 1995  Transition of Care [facility]) CM/SW Contact:  Kermit Balo, RN Phone Number: 12/07/2020, 11:33 AM   Clinical Narrative:    Patient is discharging home with self care. No needs per TOC.    Final next level of care: Home/Self Care Barriers to Discharge: No Barriers Identified   Patient Goals and CMS Choice        Discharge Placement                       Discharge Plan and Services                                     Social Determinants of Health (SDOH) Interventions     Readmission Risk Interventions No flowsheet data found.

## 2020-12-08 MED ORDER — MENTHOL 3 MG MT LOZG
1.0000 | LOZENGE | OROMUCOSAL | Status: DC | PRN
  Administered 2020-12-08: 3 mg via ORAL
  Filled 2020-12-08: qty 9

## 2020-12-08 NOTE — Plan of Care (Signed)
  Problem: Education: Goal: Knowledge of General Education information will improve Description: Including pain rating scale, medication(s)/side effects and non-pharmacologic comfort measures Outcome: Adequate for Discharge   Problem: Health Behavior/Discharge Planning: Goal: Ability to manage health-related needs will improve Outcome: Adequate for Discharge   Problem: Clinical Measurements: Goal: Ability to maintain clinical measurements within normal limits will improve Outcome: Adequate for Discharge Goal: Will remain free from infection Outcome: Adequate for Discharge Goal: Diagnostic test results will improve Outcome: Adequate for Discharge Goal: Respiratory complications will improve Outcome: Adequate for Discharge Goal: Cardiovascular complication will be avoided Outcome: Adequate for Discharge   Problem: Nutrition: Goal: Adequate nutrition will be maintained Outcome: Adequate for Discharge   Problem: Coping: Goal: Level of anxiety will decrease Outcome: Adequate for Discharge   Problem: Elimination: Goal: Will not experience complications related to bowel motility Outcome: Adequate for Discharge Goal: Will not experience complications related to urinary retention Outcome: Adequate for Discharge   Problem: Safety: Goal: Ability to remain free from injury will improve Outcome: Adequate for Discharge   Problem: Skin Integrity: Goal: Risk for impaired skin integrity will decrease Outcome: Adequate for Discharge   

## 2020-12-08 NOTE — Progress Notes (Signed)
   Providing Compassionate, Quality Care - Together  NEUROSURGERY PROGRESS NOTE   S: No issues overnight. Mild periincisional pain  O: EXAM:  BP (!) 115/58 (BP Location: Right Arm)   Pulse 70   Temp 98.4 F (36.9 C) (Oral)   Resp 18   Ht 5\' 3"  (1.6 m)   Wt 90.7 kg   SpO2 100%   BMI 35.43 kg/m   Awake, alert, oriented x3 PERRL Speech fluent, appropriate  CNs grossly intact  5/5 BUE/BLE  Incisions c./d.I Abd soft  ASSESSMENT:  25 y.o. female with   Chiari  S/p shunt  PLAN: - dc home - doing well    Thank you for allowing me to participate in this patient's care.  Please do not hesitate to call with questions or concerns.   22, DO Neurosurgeon [facility] Cell: [phone]

## 2020-12-08 NOTE — Progress Notes (Signed)
Pt requested throat lozenge. Neurosurgery on-call Dr. Maisie Fus gave V.O. for throat lozenges prn.

## 2020-12-11 ENCOUNTER — Telehealth: Payer: Self-pay

## 2020-12-11 NOTE — Telephone Encounter (Signed)
Transition Care Management Unsuccessful Follow-up Telephone Call  Date of discharge and from where:  12/08/20 from [facility]  Attempts:  1st Attempt  Reason for unsuccessful TCM follow-up call:  No answer/busy. Unable to leave message.  Kathyrn Sheriff, RN, MSN, BSN, CCM [facility] Care Management Coordinator [phone]

## 2020-12-12 ENCOUNTER — Telehealth: Payer: Self-pay

## 2020-12-12 NOTE — Telephone Encounter (Signed)
Transition Care Management Follow-up Telephone Call Date of discharge and from where: 12/03/20-12/08/20 [facility] How have you been since you were released from the hospital? "The pain is getting better".  Patient notes her pain is at level 6/10 and she has taken oxycodone, which relieves the pain. Any questions or concerns? No  Items Reviewed: Did the pt receive and understand the discharge instructions provided? Yes  Medications obtained and verified? Yes  Other? No  Any new allergies since your discharge? No  Dietary orders reviewed? Yes Do you have support at home? No -Patient has family nearby if she needs anything  Home Care and Equipment/Supplies: Were home health services ordered? no If so, what is the name of the agency? N/a Has the agency set up a time to come to the patient's home? not applicable Were any new equipment or medical supplies ordered?  No What is the name of the medical supply agency? N/a Were you able to get the supplies/equipment? not applicable Do you have any questions related to the use of the equipment or supplies? No  Functional Questionnaire: (I = Independent and D = Dependent) ADLs: I  Bathing/Dressing- I  Meal Prep- I  Eating- I  Maintaining continence- I  Transferring/Ambulation- I  Managing Meds- I  Follow up appointments reviewed:  PCP Hospital f/u appt confirmed? No   Specialist Hospital f/u appt confirmed?  Patient has called the neurologist Dr. Maurice Small to set up follow up.  Pending return call.   Are transportation arrangements needed? No  If their condition worsens, is the pt aware to call PCP or go to the Emergency Dept.? Yes Was the patient provided with contact information for the PCP's office or ED? Yes Was to pt encouraged to call back with questions or concerns? Yes

## 2021-02-01 ENCOUNTER — Other Ambulatory Visit: Payer: Self-pay

## 2021-02-01 ENCOUNTER — Ambulatory Visit (INDEPENDENT_AMBULATORY_CARE_PROVIDER_SITE_OTHER): Payer: 59 | Admitting: Nurse Practitioner

## 2021-02-01 ENCOUNTER — Other Ambulatory Visit (HOSPITAL_COMMUNITY)
Admission: RE | Admit: 2021-02-01 | Discharge: 2021-02-01 | Disposition: A | Discharge status: Home / Self Care | Payer: 59 | Source: Ambulatory Visit | Attending: Nurse Practitioner | Admitting: Nurse Practitioner

## 2021-02-01 ENCOUNTER — Encounter: Payer: Self-pay | Admitting: Nurse Practitioner

## 2021-02-01 VITALS — BP 116/78 | HR 100 | Temp 99.1°F | Ht 63.0 in | Wt 195.0 lb

## 2021-02-01 DIAGNOSIS — Z2821 Immunization not carried out because of patient refusal: Secondary | ICD-10-CM

## 2021-02-01 DIAGNOSIS — Z0000 Encounter for general adult medical examination without abnormal findings: Secondary | ICD-10-CM

## 2021-02-01 DIAGNOSIS — Z6834 Body mass index (BMI) 34.0-34.9, adult: Secondary | ICD-10-CM

## 2021-02-01 DIAGNOSIS — E6609 Other obesity due to excess calories: Secondary | ICD-10-CM

## 2021-02-01 DIAGNOSIS — N912 Amenorrhea, unspecified: Secondary | ICD-10-CM | POA: Insufficient documentation

## 2021-02-01 DIAGNOSIS — G935 Compression of brain: Secondary | ICD-10-CM

## 2021-02-01 DIAGNOSIS — R1903 Right lower quadrant abdominal swelling, mass and lump: Secondary | ICD-10-CM | POA: Diagnosis not present

## 2021-02-01 DIAGNOSIS — Z113 Encounter for screening for infections with a predominantly sexual mode of transmission: Secondary | ICD-10-CM | POA: Insufficient documentation

## 2021-02-01 LAB — LIPID PANEL
Chol/HDL Ratio: 3.2 ratio (ref 0.0–4.4)
Cholesterol, Total: 156 mg/dL (ref 100–199)
HDL: 49 mg/dL (ref >39)
LDL Chol Calc (NIH): 97 mg/dL (ref 0–99)
Triglycerides: 44 mg/dL (ref 0–149)
VLDL Cholesterol Cal: 10 mg/dL (ref 5–40)

## 2021-02-01 LAB — CBC
Hematocrit: 40.7 % (ref 34.0–46.6)
Hemoglobin: 13.4 g/dL (ref 11.1–15.9)
MCH: 28.5 pg (ref 26.6–33.0)
MCHC: 32.9 g/dL (ref 31.5–35.7)
MCV: 87 fL (ref 79–97)
Platelets: 321 10*3/uL (ref 150–450)
RBC: 4.7 x10E6/uL (ref 3.77–5.28)
RDW: 11.9 % (ref 11.7–15.4)
WBC: 5.8 10*3/uL (ref 3.4–10.8)

## 2021-02-01 LAB — HEMOGLOBIN A1C
Est. average glucose Bld gHb Est-mCnc: 123 mg/dL
Hgb A1c MFr Bld: 5.9 % — ABNORMAL HIGH (ref 4.8–5.6)

## 2021-02-01 LAB — CMP14+EGFR
ALT: 14 IU/L (ref 0–32)
AST: 15 IU/L (ref 0–40)
Albumin/Globulin Ratio: 1.7 (ref 1.2–2.2)
Albumin: 4.9 g/dL (ref 3.9–5.0)
Alkaline Phosphatase: 122 IU/L — ABNORMAL HIGH (ref 44–121)
BUN/Creatinine Ratio: 18 (ref 9–23)
BUN: 17 mg/dL (ref 6–20)
Bilirubin Total: <0.2 mg/dL (ref 0.0–1.2)
CO2: 19 mmol/L — ABNORMAL LOW (ref 20–29)
Calcium: 9.4 mg/dL (ref 8.7–10.2)
Chloride: 104 mmol/L (ref 96–106)
Creatinine, Ser: 0.94 mg/dL (ref 0.57–1.00)
Globulin, Total: 2.9 g/dL (ref 1.5–4.5)
Glucose: 78 mg/dL (ref 70–99)
Potassium: 4.3 mmol/L (ref 3.5–5.2)
Sodium: 142 mmol/L (ref 134–144)
Total Protein: 7.8 g/dL (ref 6.0–8.5)
eGFR: 86 mL/min/{1.73_m2} (ref >59)

## 2021-02-01 NOTE — Patient Instructions (Signed)
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
## 2021-02-05 LAB — CERVICOVAGINAL ANCILLARY ONLY
Bacterial Vaginitis (gardnerella): NEGATIVE
Candida Glabrata: NEGATIVE
Candida Vaginitis: NEGATIVE
Chlamydia: NEGATIVE
Comment: NEGATIVE
Comment: NEGATIVE
Comment: NEGATIVE
Comment: NEGATIVE
Comment: NEGATIVE
Comment: NORMAL
Neisseria Gonorrhea: NEGATIVE
Trichomonas: NEGATIVE

## 2021-02-13 ENCOUNTER — Ambulatory Visit
Admission: RE | Admit: 2021-02-13 | Discharge: 2021-02-13 | Disposition: A | Discharge status: Home / Self Care | Payer: 59 | Source: Ambulatory Visit | Attending: Nurse Practitioner | Admitting: Nurse Practitioner

## 2021-02-13 DIAGNOSIS — R1903 Right lower quadrant abdominal swelling, mass and lump: Secondary | ICD-10-CM
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
## 2021-12-11 DIAGNOSIS — E282 Polycystic ovarian syndrome: Secondary | ICD-10-CM | POA: Insufficient documentation

## 2022-02-13 ENCOUNTER — Encounter: Payer: Self-pay | Admitting: Nurse Practitioner

## 2022-02-13 ENCOUNTER — Ambulatory Visit (INDEPENDENT_AMBULATORY_CARE_PROVIDER_SITE_OTHER): Payer: 59 | Admitting: Nurse Practitioner

## 2022-02-13 VITALS — BP 126/68 | HR 81 | Temp 97.5°F | Ht 63.0 in | Wt 189.0 lb

## 2022-02-13 DIAGNOSIS — Z0000 Encounter for general adult medical examination without abnormal findings: Secondary | ICD-10-CM | POA: Diagnosis not present

## 2022-02-13 DIAGNOSIS — Z2821 Immunization not carried out because of patient refusal: Secondary | ICD-10-CM

## 2022-02-13 DIAGNOSIS — Q0700 Arnold-Chiari syndrome without spina bifida or hydrocephalus: Secondary | ICD-10-CM

## 2022-02-13 DIAGNOSIS — E6609 Other obesity due to excess calories: Secondary | ICD-10-CM | POA: Diagnosis not present

## 2022-02-13 DIAGNOSIS — Z6833 Body mass index (BMI) 33.0-33.9, adult: Secondary | ICD-10-CM | POA: Diagnosis not present
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
## 2022-02-13 NOTE — Patient Instructions (Signed)

## 2022-02-14 LAB — CBC
Hematocrit: 38.5 % (ref 34.0–46.6)
Hemoglobin: 12.8 g/dL (ref 11.1–15.9)
MCH: 30 pg (ref 26.6–33.0)
MCHC: 33.2 g/dL (ref 31.5–35.7)
MCV: 90 fL (ref 79–97)
Platelets: 350 10*3/uL (ref 150–450)
RBC: 4.27 x10E6/uL (ref 3.77–5.28)
RDW: 12.1 % (ref 11.7–15.4)
WBC: 5.7 10*3/uL (ref 3.4–10.8)

## 2022-02-14 LAB — BMP8+EGFR
BUN/Creatinine Ratio: 21 (ref 9–23)
BUN: 18 mg/dL (ref 6–20)
CO2: 21 mmol/L (ref 20–29)
Calcium: 9.4 mg/dL (ref 8.7–10.2)
Chloride: 103 mmol/L (ref 96–106)
Creatinine, Ser: 0.85 mg/dL (ref 0.57–1.00)
Glucose: 69 mg/dL — ABNORMAL LOW (ref 70–99)
Potassium: 4.5 mmol/L (ref 3.5–5.2)
Sodium: 138 mmol/L (ref 134–144)
eGFR: 96 mL/min/{1.73_m2} (ref >59)

## 2022-02-14 LAB — VITAMIN D 25 HYDROXY (VIT D DEFICIENCY, FRACTURES): Vit D, 25-Hydroxy: 10.5 ng/mL — ABNORMAL LOW (ref 30.0–100.0)

## 2022-03-01 MED ORDER — VITAMIN D (ERGOCALCIFEROL) 1.25 MG (50000 UNIT) PO CAPS: 50000.0000 [IU] | ORAL_CAPSULE | ORAL | 1 refills | Status: DC

## 2022-04-10 ENCOUNTER — Telehealth: Payer: Self-pay | Admitting: Internal Medicine

## 2022-04-10 NOTE — Telephone Encounter (Signed)
Pt states she does not need to f/u per recall because she had surgery for her syncope.

## 2022-05-06 ENCOUNTER — Ambulatory Visit: Payer: 59 | Admitting: Nurse Practitioner

## 2022-05-19 ENCOUNTER — Emergency Department (HOSPITAL_BASED_OUTPATIENT_CLINIC_OR_DEPARTMENT_OTHER)
Admission: EM | Admit: 2022-05-19 | Discharge: 2022-05-19 | Disposition: A | Discharge status: Home / Self Care | Payer: 59 | Attending: Emergency Medicine | Admitting: Emergency Medicine

## 2022-05-19 ENCOUNTER — Encounter (HOSPITAL_BASED_OUTPATIENT_CLINIC_OR_DEPARTMENT_OTHER): Payer: Self-pay

## 2022-05-19 DIAGNOSIS — K529 Noninfective gastroenteritis and colitis, unspecified: Secondary | ICD-10-CM

## 2022-05-19 DIAGNOSIS — Z1152 Encounter for screening for COVID-19: Secondary | ICD-10-CM | POA: Insufficient documentation

## 2022-05-19 DIAGNOSIS — R197 Diarrhea, unspecified: Secondary | ICD-10-CM | POA: Diagnosis not present

## 2022-05-19 DIAGNOSIS — R112 Nausea with vomiting, unspecified: Secondary | ICD-10-CM | POA: Insufficient documentation

## 2022-05-19 LAB — URINALYSIS, ROUTINE W REFLEX MICROSCOPIC
Bilirubin Urine: NEGATIVE
Glucose, UA: NEGATIVE mg/dL
Ketones, ur: NEGATIVE mg/dL
Leukocytes,Ua: NEGATIVE
Nitrite: NEGATIVE
Protein, ur: 30 mg/dL — AB
Specific Gravity, Urine: >=1.03 (ref 1.005–1.030)
pH: 6 (ref 5.0–8.0)

## 2022-05-19 LAB — RESP PANEL BY RT-PCR (RSV, FLU A&B, COVID)  RVPGX2
Influenza A by PCR: NEGATIVE
Influenza B by PCR: NEGATIVE
Resp Syncytial Virus by PCR: NEGATIVE
SARS Coronavirus 2 by RT PCR: NEGATIVE

## 2022-05-19 LAB — COMPREHENSIVE METABOLIC PANEL
ALT: 20 U/L (ref 0–44)
AST: 26 U/L (ref 15–41)
Albumin: 4 g/dL (ref 3.5–5.0)
Alkaline Phosphatase: 98 U/L (ref 38–126)
Anion gap: 8 (ref 5–15)
BUN: 17 mg/dL (ref 6–20)
CO2: 21 mmol/L — ABNORMAL LOW (ref 22–32)
Calcium: 9 mg/dL (ref 8.9–10.3)
Chloride: 101 mmol/L (ref 98–111)
Creatinine, Ser: 1.14 mg/dL — ABNORMAL HIGH (ref 0.44–1.00)
GFR, Estimated: >60 mL/min (ref >60)
Glucose, Bld: 106 mg/dL — ABNORMAL HIGH (ref 70–99)
Potassium: 4.2 mmol/L (ref 3.5–5.1)
Sodium: 130 mmol/L — ABNORMAL LOW (ref 135–145)
Total Bilirubin: 0.5 mg/dL (ref 0.3–1.2)
Total Protein: 8.5 g/dL — ABNORMAL HIGH (ref 6.5–8.1)

## 2022-05-19 LAB — URINALYSIS, MICROSCOPIC (REFLEX)

## 2022-05-19 LAB — LIPASE, BLOOD: Lipase: 35 U/L (ref 11–51)

## 2022-05-19 LAB — CBC
HCT: 40.4 % (ref 36.0–46.0)
Hemoglobin: 13.3 g/dL (ref 12.0–15.0)
MCH: 29.5 pg (ref 26.0–34.0)
MCHC: 32.9 g/dL (ref 30.0–36.0)
MCV: 89.6 fL (ref 80.0–100.0)
Platelets: 275 10*3/uL (ref 150–400)
RBC: 4.51 MIL/uL (ref 3.87–5.11)
RDW: 12.7 % (ref 11.5–15.5)
WBC: 10.4 10*3/uL (ref 4.0–10.5)
nRBC: 0 % (ref 0.0–0.2)

## 2022-05-19 LAB — PREGNANCY, URINE: Preg Test, Ur: NEGATIVE

## 2022-05-19 MED ORDER — SODIUM CHLORIDE 0.9 % IV BOLUS: 1000.0000 mL | Freq: Once | INTRAVENOUS | Status: DC

## 2022-05-19 MED ORDER — KETOROLAC TROMETHAMINE 60 MG/2ML IM SOLN
60.0000 mg | Freq: Once | INTRAMUSCULAR | Status: AC
  Administered 2022-05-19: 60 mg via INTRAMUSCULAR
  Filled 2022-05-19: qty 2

## 2022-05-19 MED ORDER — KETOROLAC TROMETHAMINE 30 MG/ML IJ SOLN
30.0000 mg | Freq: Once | INTRAMUSCULAR | Status: DC
  Filled 2022-05-19: qty 1

## 2022-05-19 MED ORDER — ONDANSETRON 8 MG PO TBDP: ORAL_TABLET | ORAL | 0 refills | Status: DC

## 2022-05-19 MED ORDER — ONDANSETRON 4 MG PO TBDP
8.0000 mg | ORAL_TABLET | Freq: Once | ORAL | Status: AC
  Administered 2022-05-19: 8 mg via ORAL
  Filled 2022-05-19: qty 2

## 2022-05-19 MED ORDER — ONDANSETRON HCL 4 MG/2ML IJ SOLN
4.0000 mg | Freq: Once | INTRAMUSCULAR | Status: DC
  Filled 2022-05-19: qty 2

## 2022-05-19 NOTE — ED Triage Notes (Signed)
Pt c/o bodyaches, abd cramping, lethargy, back pain gradual onset 24hrs ago. States she's she's just returned from [place] at midnight, no known sick contacts

## 2022-05-19 NOTE — Discharge Instructions (Signed)
Begin taking Zofran as prescribed as needed for nausea.  Clear liquid diet for the next 12 hours, then slowly advance to normal as tolerated.  Return to the ER if symptoms significantly worsen or change.

## 2022-05-19 NOTE — ED Provider Notes (Signed)
[place] [facility] Provider Note   CSN: [id number] Arrival date & time: 05/19/22  0048     History  Chief Complaint  Patient presents with   Abdominal Pain    Shelly Leblanc is a 27 y.o. female.  Patient is a 27 year old female with past medical history of prior VP shunt.  Patient presenting today for evaluation of abdominal cramping, nausea, vomiting, and diarrhea.  This started earlier today.  Patient just returned today from [place] where she was studying abroad.  She denies any bloody stool.  She denies any fevers or chills.  She does describe some generalized bodyaches.  No urinary complaints.  No aggravating or alleviating factors.  The history is provided by the patient.       Home Medications Prior to Admission medications   Medication Sig Start Date End Date Taking? Authorizing Provider  spironolactone (ALDACTONE) 50 MG tablet Take 50 mg by mouth daily. 04/07/22   [provider]  Vitamin D, Ergocalciferol, (DRISDOL) 1.25 MG (50000 UNIT) CAPS capsule Take 1 capsule (50,000 Units total) by mouth 2 (two) times a week. 03/04/22   [name], FNP      Allergies    Clindamycin/lincomycin    Review of Systems   Review of Systems  All other systems reviewed and are negative.   Physical Exam Updated Vital Signs BP 123/87   Pulse (!) 111   Temp 98.7 F (37.1 C) (Oral)   Resp 16   SpO2 100%  Physical Exam Vitals and nursing note reviewed.  Constitutional:      General: She is not in acute distress.    Appearance: She is well-developed. She is not diaphoretic.  HENT:     Head: Normocephalic and atraumatic.  Cardiovascular:     Rate and Rhythm: Normal rate and regular rhythm.     Heart sounds: No murmur heard.    No friction rub. No gallop.  Pulmonary:     Effort: Pulmonary effort is normal. No respiratory distress.     Breath sounds: Normal breath sounds. No wheezing.  Abdominal:     General: Bowel sounds are  normal. There is no distension.     Palpations: Abdomen is soft.     Tenderness: There is no abdominal tenderness.  Musculoskeletal:        General: Normal range of motion.     Cervical back: Normal range of motion and neck supple.  Skin:    General: Skin is warm and dry.  Neurological:     General: No focal deficit present.     Mental Status: She is alert and oriented to person, place, and time.     ED Results / Procedures / Treatments   Labs (all labs ordered are listed, but only abnormal results are displayed) Labs Reviewed  COMPREHENSIVE METABOLIC PANEL - Abnormal; Notable for the following components:      Result Value   Sodium 130 (*)    CO2 21 (*)    Glucose, Bld 106 (*)    Creatinine, Ser 1.14 (*)    Total Protein 8.5 (*)    All other components within normal limits  URINALYSIS, ROUTINE W REFLEX MICROSCOPIC - Abnormal; Notable for the following components:   Hgb urine dipstick SMALL (*)    Protein, ur 30 (*)    All other components within normal limits  URINALYSIS, MICROSCOPIC (REFLEX) - Abnormal; Notable for the following components:   Bacteria, UA MANY (*)    All  other components within normal limits  RESP PANEL BY RT-PCR (RSV, FLU A&B, COVID)  RVPGX2  LIPASE, BLOOD  CBC  PREGNANCY, URINE    EKG None  Radiology No results found.  Procedures Procedures    Medications Ordered in ED Medications  sodium chloride 0.9 % bolus 1,000 mL (has no administration in time range)  ondansetron (ZOFRAN) injection 4 mg (has no administration in time range)  ketorolac (TORADOL) 30 MG/ML injection 30 mg (has no administration in time range)    ED Course/ Medical Decision Making/ A&P  Patient is a 27 year old female presenting with complaints of nausea, vomiting, diarrhea, and bodyaches since earlier today.  This started shortly after returning home from [place].  Patient arrives here with stable vital signs.  She is clinically well-appearing and physical examination is  basically unremarkable.  Workup initiated including CBC, metabolic panel, respiratory panel.  All of these are unremarkable.  Patient given IM Toradol along with ODT Zofran and is tolerating liquids and feeling somewhat better.  Symptoms are most likely viral in nature.  Patient to be discharged with ODT Zofran, clear liquids, and follow-up as needed.  Final Clinical Impression(s) / ED Diagnoses Final diagnoses:  None    Rx / DC Orders ED Discharge Orders     None         Veryl Speak, MD 05/19/22 [phone]

## 2022-05-22 ENCOUNTER — Encounter (HOSPITAL_BASED_OUTPATIENT_CLINIC_OR_DEPARTMENT_OTHER): Payer: Self-pay | Admitting: Urology

## 2022-05-22 ENCOUNTER — Other Ambulatory Visit: Payer: Self-pay

## 2022-05-22 ENCOUNTER — Emergency Department (HOSPITAL_BASED_OUTPATIENT_CLINIC_OR_DEPARTMENT_OTHER)
Admission: EM | Admit: 2022-05-22 | Discharge: 2022-05-23 | Disposition: A | Discharge status: Home / Self Care | Payer: 59 | Attending: Emergency Medicine | Admitting: Emergency Medicine

## 2022-05-22 ENCOUNTER — Emergency Department (HOSPITAL_BASED_OUTPATIENT_CLINIC_OR_DEPARTMENT_OTHER): Payer: 59

## 2022-05-22 DIAGNOSIS — K529 Noninfective gastroenteritis and colitis, unspecified: Secondary | ICD-10-CM | POA: Insufficient documentation

## 2022-05-22 DIAGNOSIS — R109 Unspecified abdominal pain: Secondary | ICD-10-CM | POA: Diagnosis present

## 2022-05-22 LAB — URINALYSIS, ROUTINE W REFLEX MICROSCOPIC
Bilirubin Urine: NEGATIVE
Glucose, UA: NEGATIVE mg/dL
Ketones, ur: NEGATIVE mg/dL
Leukocytes,Ua: NEGATIVE
Nitrite: NEGATIVE
Protein, ur: 100 mg/dL — AB
Specific Gravity, Urine: 1.02 (ref 1.005–1.030)
pH: 6 (ref 5.0–8.0)

## 2022-05-22 LAB — CBC
HCT: 36.5 % (ref 36.0–46.0)
Hemoglobin: 12.4 g/dL (ref 12.0–15.0)
MCH: 29.1 pg (ref 26.0–34.0)
MCHC: 34 g/dL (ref 30.0–36.0)
MCV: 85.7 fL (ref 80.0–100.0)
Platelets: 346 10*3/uL (ref 150–400)
RBC: 4.26 MIL/uL (ref 3.87–5.11)
RDW: 12.8 % (ref 11.5–15.5)
WBC: 9.2 10*3/uL (ref 4.0–10.5)
nRBC: 0 % (ref 0.0–0.2)

## 2022-05-22 LAB — COMPREHENSIVE METABOLIC PANEL
ALT: 30 U/L (ref 0–44)
AST: 27 U/L (ref 15–41)
Albumin: 3 g/dL — ABNORMAL LOW (ref 3.5–5.0)
Alkaline Phosphatase: 114 U/L (ref 38–126)
Anion gap: 7 (ref 5–15)
BUN: 13 mg/dL (ref 6–20)
CO2: 23 mmol/L (ref 22–32)
Calcium: 8.2 mg/dL — ABNORMAL LOW (ref 8.9–10.3)
Chloride: 99 mmol/L (ref 98–111)
Creatinine, Ser: 1.09 mg/dL — ABNORMAL HIGH (ref 0.44–1.00)
GFR, Estimated: >60 mL/min (ref >60)
Glucose, Bld: 115 mg/dL — ABNORMAL HIGH (ref 70–99)
Potassium: 3.6 mmol/L (ref 3.5–5.1)
Sodium: 129 mmol/L — ABNORMAL LOW (ref 135–145)
Total Bilirubin: 0.6 mg/dL (ref 0.3–1.2)
Total Protein: 7.3 g/dL (ref 6.5–8.1)

## 2022-05-22 LAB — LACTIC ACID, PLASMA: Lactic Acid, Venous: 0.9 mmol/L (ref 0.5–1.9)

## 2022-05-22 LAB — URINALYSIS, MICROSCOPIC (REFLEX)

## 2022-05-22 LAB — PREGNANCY, URINE: Preg Test, Ur: NEGATIVE

## 2022-05-22 LAB — LIPASE, BLOOD: Lipase: 23 U/L (ref 11–51)

## 2022-05-22 MED ORDER — IOHEXOL 300 MG/ML  SOLN
100.0000 mL | Freq: Once | INTRAMUSCULAR | Status: AC | PRN
  Administered 2022-05-22: 100 mL via INTRAVENOUS

## 2022-05-22 MED ORDER — DICYCLOMINE HCL 20 MG PO TABS: 20.0000 mg | ORAL_TABLET | Freq: Two times a day (BID) | ORAL | 0 refills | Status: DC

## 2022-05-22 MED ORDER — SODIUM CHLORIDE 0.9 % IV BOLUS
1000.0000 mL | Freq: Once | INTRAVENOUS | Status: AC
  Administered 2022-05-22: 1000 mL via INTRAVENOUS

## 2022-05-22 MED ORDER — ACETAMINOPHEN 325 MG PO TABS
650.0000 mg | ORAL_TABLET | Freq: Once | ORAL | Status: AC
  Administered 2022-05-22: 650 mg via ORAL
  Filled 2022-05-22: qty 2

## 2022-05-22 MED ORDER — KETOROLAC TROMETHAMINE 15 MG/ML IJ SOLN
15.0000 mg | Freq: Once | INTRAMUSCULAR | Status: AC
  Administered 2022-05-22: 15 mg via INTRAVENOUS
  Filled 2022-05-22: qty 1

## 2022-05-22 MED ORDER — AMOXICILLIN-POT CLAVULANATE 875-125 MG PO TABS: 1.0000 | ORAL_TABLET | Freq: Two times a day (BID) | ORAL | 0 refills | Status: DC

## 2022-05-22 NOTE — ED Provider Notes (Signed)
[place] [facility] Provider Note   CSN: [id number] Arrival date & time: 05/22/22  2100     History  Chief Complaint  Patient presents with   Abdominal Pain    Shelly Leblanc is a 27 y.o. female.  Patient with history of Chiari malformation and VP shunt placement presents today with complaints of nausea, vomiting, and diarrhea. She states that same began on Friday when she was in [place], [place] studying abroad. She flew back to the [place] on Saturday and presented here for same at that time and had benign labs and was diagnosed with gastroenteritis and given zofran. She states that since then she has been having persistent diarrhea every few hours. States that she is now having bloody diarrhea as well since Monday with fevers and chills as well. Endorses generalized abdominal pain. No history of abdominal surgeries. No recent antibiotic use, eating suspicious foods or drinking from unclean water sources. She was exclusively in [place] over the past several days and denies travelling to rural or secluded areas. No known sick contacts.   The history is provided by the patient. No language interpreter was used.  Abdominal Pain Associated symptoms: diarrhea, fever, nausea and vomiting        Home Medications Prior to Admission medications   Medication Sig Start Date End Date Taking? Authorizing Provider  ondansetron (ZOFRAN-ODT) 8 MG disintegrating tablet 8mg  ODT q4 hours prn nausea 05/19/22   Veryl Speak, MD  spironolactone (ALDACTONE) 50 MG tablet Take 50 mg by mouth daily. 04/07/22   [provider]  Vitamin D, Ergocalciferol, (DRISDOL) 1.25 MG (50000 UNIT) CAPS capsule Take 1 capsule (50,000 Units total) by mouth 2 (two) times a week. 03/04/22   [name], FNP      Allergies    Clindamycin/lincomycin    Review of Systems   Review of Systems  Constitutional:  Positive for fever.  Gastrointestinal:  Positive for abdominal pain, diarrhea,  nausea and vomiting.  All other systems reviewed and are negative.   Physical Exam Updated Vital Signs BP 136/81 (BP Location: Left Arm)   Pulse (!) 132   Temp (!) 101.8 F (38.8 C)   Resp 18   Ht 5\' 3"  (1.6 m)   Wt 83.9 kg   SpO2 100%   BMI 32.77 kg/m  Physical Exam Vitals and nursing note reviewed.  Constitutional:      General: She is not in acute distress.    Appearance: Normal appearance. She is normal weight. She is not ill-appearing, toxic-appearing or diaphoretic.  HENT:     Head: Normocephalic and atraumatic.  Cardiovascular:     Rate and Rhythm: Normal rate.  Pulmonary:     Effort: Pulmonary effort is normal. No respiratory distress.  Abdominal:     General: Abdomen is flat.     Palpations: Abdomen is soft.     Tenderness: There is generalized abdominal tenderness.  Musculoskeletal:        General: Normal range of motion.     Cervical back: Normal range of motion.  Skin:    General: Skin is warm and dry.  Neurological:     General: No focal deficit present.     Mental Status: She is alert.  Psychiatric:        Mood and Affect: Mood normal.        Behavior: Behavior normal.     ED Results / Procedures / Treatments   Labs (all labs ordered are listed, but  only abnormal results are displayed) Labs Reviewed  COMPREHENSIVE METABOLIC PANEL - Abnormal; Notable for the following components:      Result Value   Sodium 129 (*)    Glucose, Bld 115 (*)    Creatinine, Ser 1.09 (*)    Calcium 8.2 (*)    Albumin 3.0 (*)    All other components within normal limits  URINALYSIS, ROUTINE W REFLEX MICROSCOPIC - Abnormal; Notable for the following components:   APPearance CLOUDY (*)    Hgb urine dipstick LARGE (*)    Protein, ur 100 (*)    All other components within normal limits  URINALYSIS, MICROSCOPIC (REFLEX) - Abnormal; Notable for the following components:   Bacteria, UA FEW (*)    All other components within normal limits  GASTROINTESTINAL PANEL BY PCR,  STOOL (REPLACES STOOL CULTURE)  C DIFFICILE QUICK SCREEN W PCR REFLEX    LIPASE, BLOOD  CBC  PREGNANCY, URINE  LACTIC ACID, PLASMA    EKG None  Radiology CT ABDOMEN PELVIS W CONTRAST  Result Date: 05/22/2022 CLINICAL DATA:  Abdomen pain EXAM: CT ABDOMEN AND PELVIS WITH CONTRAST TECHNIQUE: Multidetector CT imaging of the abdomen and pelvis was performed using the standard protocol following bolus administration of intravenous contrast. RADIATION DOSE REDUCTION: This exam was performed according to the departmental dose-optimization program which includes automated exposure control, adjustment of the mA and/or kV according to patient size and/or use of iterative reconstruction technique. CONTRAST:  169mL OMNIPAQUE IOHEXOL 300 MG/ML  SOLN COMPARISON:  None Available. FINDINGS: Lower chest: No acute abnormality. Hepatobiliary: No focal liver abnormality is seen. No gallstones, gallbladder wall thickening, or biliary dilatation. Pancreas: Unremarkable. No pancreatic ductal dilatation or surrounding inflammatory changes. Spleen: Normal in size without focal abnormality. Adrenals/Urinary Tract: Adrenal glands are unremarkable. Kidneys are normal, without renal calculi, focal lesion, or hydronephrosis. Bladder is unremarkable. Stomach/Bowel: The stomach is nonenlarged. No dilated small bowel. Diffuse colon wall thickening with mucosal enhancement consistent with colitis. Appendix upper normal in size but otherwise no convincing appendicitis. Vascular/Lymphatic: No significant vascular findings are present. No enlarged abdominal or pelvic lymph nodes. Reproductive: Uterus and bilateral adnexa are unremarkable. Other: Shunt catheter with tip in the left lower quadrant. No free air. Moderate fluid in the pelvis. Musculoskeletal: No acute or significant osseous findings. IMPRESSION: 1. Diffuse colon wall thickening with mucosal enhancement consistent with colitis of infectious (to include C difficile) or  inflammatory etiology. 2. Shunt catheter with tip in the left lower quadrant. Moderate fluid in the pelvis. Electronically Signed   By: Donavan Foil M.D.   On: 05/22/2022 22:12    Procedures Procedures    Medications Ordered in ED Medications  acetaminophen (TYLENOL) tablet 650 mg (has no administration in time range)  sodium chloride 0.9 % bolus 1,000 mL (1,000 mLs Intravenous New Bag/Given 05/22/22 2214)  ketorolac (TORADOL) 15 MG/ML injection 15 mg (15 mg Intravenous Given 05/22/22 2211)  iohexol (OMNIPAQUE) 300 MG/ML solution 100 mL (100 mLs Intravenous Contrast Given 05/22/22 2153)    ED Course/ Medical Decision Making/ A&P                             Medical Decision Making Amount and/or Complexity of Data Reviewed Labs: ordered. Radiology: ordered.  Risk OTC drugs. Prescription drug management.   This patient is a 27 y.o. female who presents to the ED for concern of abdominal pain, nausea, vomiting, and diarrhea, this involves an extensive number of treatment  options, and is a complaint that carries with it a high risk of complications and morbidity. The emergent differential diagnosis prior to evaluation includes, but is not limited to,  The differential diagnosis for generalized abdominal pain includes, but is not limited to gastroenteritis, appendicitis, Bowel obstruction, Bowel perforation. Gastroparesis, DKA, Hernia, Inflammatory bowel disease, mesenteric ischemia, pancreatitis, peritonitis SBP, volvulus.   This is not an exhaustive differential.   Past Medical History / Co-morbidities / Social History: Recent travel from [place], [place]  Additional history: Chart reviewed. Pertinent results include: seen here on 3/17 with benign labs and diagnosed with gastroenteritis and given Zofran  Physical Exam: Physical exam performed. The pertinent findings include: Generalized abdominal tenderness to palpation, no rebound or guarding  Lab Tests: I ordered, and personally  interpreted labs.  The pertinent results include: No leukocytosis or anemia. Na 129, creatinine 1.09 unchanged from previous   Imaging Studies: I ordered imaging studies including CT abdomen pelvis. I independently visualized and interpreted imaging which showed   1. Diffuse colon wall thickening with mucosal enhancement consistent with colitis of infectious (to include C difficile) or inflammatory etiology. 2. Shunt catheter with tip in the left lower quadrant. Moderate fluid in the pelvis.  I agree with the radiologist interpretation.   Medications: I ordered medication including tylenol, toradol, fluids  for fever, pain, dehydration. Reevaluation of the patient after these medicines showed that the patient improved. I have reviewed the patients home medicines and have made adjustments as needed.  Disposition:  Patient presents today with complaints of nausea, vomiting, and diarrhea. She is notably febrile at 101.8, improved with toradol and tylenol.  She is nontoxic-appearing and in no acute distress with otherwise reassuring vital signs.  CT imaging shows colitis.  Attempted to get stool studies, however after multiple attempts over several hours, patient is unable to have a bowel movement.  I do have low suspicion for C. difficile given patient's low risk age category with no risk factors to increase her risk for C. difficile.  Will treat for colitis with Augmentin and give Bentyl for pain.  Will recommend close PCP follow-up and close return precautions as well. Evaluation and diagnostic testing in the emergency department does not suggest an emergent condition requiring admission or immediate intervention beyond what has been performed at this time.  Plan for discharge with close PCP follow-up.  Patient is understanding and amenable with plan, educated on red flag symptoms that would prompt immediate return.  Patient discharged in stable condition.  Findings and plan of care discussed with  supervising physician Dr. Melina Copa who is in agreement.     Final Clinical Impression(s) / ED Diagnoses Final diagnoses:  Colitis    Rx / DC Orders ED Discharge Orders          Ordered    dicyclomine (BENTYL) 20 MG tablet  2 times daily        05/22/22 2335    amoxicillin-clavulanate (AUGMENTIN) 875-125 MG tablet  Every 12 hours        05/22/22 2335          An After Visit Summary was printed and given to the patient.     Nestor Lewandowsky 05/22/22 2336    Hayden Rasmussen, MD 05/23/22 1014

## 2022-05-22 NOTE — Discharge Instructions (Signed)
As we discussed, CT imaging of your abdomen did show concerns for colitis.  This can be caused by several different things, but is likely either viral or bacterial in nature.  I have given you a prescription for an antibiotic Augmentin to cover for any bacterial cause of symptoms.  Please take this in its entirety for management of your symptoms.  I have also given you a prescription for Bentyl which is a medication to help with your pain.  Please take this as prescribed as needed.  You may also take 800 mg of ibuprofen and 1000 mg of Tylenol every 6 hours for any additional discomfort.  Follow-up with your primary care doctor at your earliest convenience.  Return if development of any new or worsening symptoms.

## 2022-05-22 NOTE — ED Triage Notes (Signed)
Pt seen Sunday and diagnosed with acute gastroenteritis  Pt states now has a fever, worsening cramps, diarrhea, weakness, abd pain  Fever at triage  Tylenol at 1600

## 2022-05-22 NOTE — ED Notes (Signed)
Pt ambulatory to bathroom, unable to provide stool sample at this time.

## 2022-05-22 NOTE — ED Notes (Signed)
D/c paperwork reviewed with pt, including prescriptions.  All questions and/or concerns addressed at time of d/c.  No further needs expressed. . Pt verbalized understanding, Ambulatory with family to ED exit, NAD.   

## 2022-05-22 NOTE — ED Notes (Signed)
ED Provider at bedside. 

## 2022-06-06 ENCOUNTER — Other Ambulatory Visit (HOSPITAL_COMMUNITY)
Admission: RE | Admit: 2022-06-06 | Discharge: 2022-06-06 | Disposition: A | Discharge status: Home / Self Care | Payer: 59 | Source: Ambulatory Visit | Attending: Nurse Practitioner | Admitting: Nurse Practitioner

## 2022-06-06 ENCOUNTER — Ambulatory Visit (INDEPENDENT_AMBULATORY_CARE_PROVIDER_SITE_OTHER): Payer: 59 | Admitting: Nurse Practitioner

## 2022-06-06 ENCOUNTER — Encounter: Payer: Self-pay | Admitting: Nurse Practitioner

## 2022-06-06 VITALS — BP 112/56 | Temp 98.3°F | Ht 63.0 in | Wt 183.0 lb

## 2022-06-06 DIAGNOSIS — Z113 Encounter for screening for infections with a predominantly sexual mode of transmission: Secondary | ICD-10-CM | POA: Insufficient documentation

## 2022-06-06 DIAGNOSIS — R1084 Generalized abdominal pain: Secondary | ICD-10-CM | POA: Diagnosis not present
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
## 2022-06-10 LAB — URINE CYTOLOGY ANCILLARY ONLY
Bacterial Vaginitis-Urine: NEGATIVE
Candida Urine: NEGATIVE
Chlamydia: NEGATIVE
Comment: NEGATIVE
Comment: NEGATIVE
Comment: NORMAL
Neisseria Gonorrhea: NEGATIVE
Trichomonas: NEGATIVE

## 2022-10-03 NOTE — Addendum Note (Signed)
Addendum  created 10/03/22 1416 by Elliot Dally, CRNA   Cosign clinical note

## 2023-02-14 ENCOUNTER — Ambulatory Visit: Payer: 59 | Admitting: Physician Assistant

## 2023-02-18 ENCOUNTER — Encounter: Payer: 59 | Admitting: Nurse Practitioner
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
## 2023-02-21 ENCOUNTER — Encounter: Payer: Self-pay | Admitting: Physician Assistant

## 2023-02-21 ENCOUNTER — Ambulatory Visit (INDEPENDENT_AMBULATORY_CARE_PROVIDER_SITE_OTHER): Payer: 59 | Admitting: Physician Assistant

## 2023-02-21 VITALS — BP 124/79 | HR 88 | Temp 98.5°F | Ht 63.0 in | Wt 188.1 lb

## 2023-02-21 DIAGNOSIS — Z0000 Encounter for general adult medical examination without abnormal findings: Secondary | ICD-10-CM | POA: Diagnosis not present

## 2023-02-21 DIAGNOSIS — R739 Hyperglycemia, unspecified: Secondary | ICD-10-CM | POA: Diagnosis not present

## 2023-02-25 ENCOUNTER — Other Ambulatory Visit: Payer: 59

## 2023-02-27 ENCOUNTER — Other Ambulatory Visit (INDEPENDENT_AMBULATORY_CARE_PROVIDER_SITE_OTHER): Payer: 59

## 2023-02-27 DIAGNOSIS — R739 Hyperglycemia, unspecified: Secondary | ICD-10-CM

## 2023-02-27 DIAGNOSIS — Z0000 Encounter for general adult medical examination without abnormal findings: Secondary | ICD-10-CM | POA: Diagnosis not present

## 2023-02-27 LAB — COMPREHENSIVE METABOLIC PANEL
ALT: 12 U/L (ref 0–35)
AST: 21 U/L (ref 0–37)
Albumin: 4.5 g/dL (ref 3.5–5.2)
Alkaline Phosphatase: 77 U/L (ref 39–117)
BUN: 17 mg/dL (ref 6–23)
CO2: 28 meq/L (ref 19–32)
Calcium: 9.2 mg/dL (ref 8.4–10.5)
Chloride: 106 meq/L (ref 96–112)
Creatinine, Ser: 0.85 mg/dL (ref 0.40–1.20)
GFR: 93.41 mL/min (ref >60.00)
Glucose, Bld: 88 mg/dL (ref 70–99)
Potassium: 4.6 meq/L (ref 3.5–5.1)
Sodium: 141 meq/L (ref 135–145)
Total Bilirubin: 0.3 mg/dL (ref 0.2–1.2)
Total Protein: 7.5 g/dL (ref 6.0–8.3)

## 2023-02-27 LAB — LIPID PANEL
Cholesterol: 162 mg/dL (ref 0–200)
HDL: 58.1 mg/dL (ref >39.00)
LDL Cholesterol: 91 mg/dL (ref 0–99)
NonHDL: 103.62
Total CHOL/HDL Ratio: 3
Triglycerides: 65 mg/dL (ref 0.0–149.0)
VLDL: 13 mg/dL (ref 0.0–40.0)

## 2023-02-27 LAB — CBC WITH DIFFERENTIAL/PLATELET
Basophils Absolute: 0 10*3/uL (ref 0.0–0.1)
Basophils Relative: 0.7 % (ref 0.0–3.0)
Eosinophils Absolute: 0.1 10*3/uL (ref 0.0–0.7)
Eosinophils Relative: 0.8 % (ref 0.0–5.0)
HCT: 37.8 % (ref 36.0–46.0)
Hemoglobin: 12.5 g/dL (ref 12.0–15.0)
Lymphocytes Relative: 46.1 % — ABNORMAL HIGH (ref 12.0–46.0)
Lymphs Abs: 2.9 10*3/uL (ref 0.7–4.0)
MCHC: 32.9 g/dL (ref 30.0–36.0)
MCV: 91.6 fL (ref 78.0–100.0)
Monocytes Absolute: 0.7 10*3/uL (ref 0.1–1.0)
Monocytes Relative: 10.5 % (ref 3.0–12.0)
Neutro Abs: 2.6 10*3/uL (ref 1.4–7.7)
Neutrophils Relative %: 41.9 % — ABNORMAL LOW (ref 43.0–77.0)
Platelets: 338 10*3/uL (ref 150.0–400.0)
RBC: 4.13 Mil/uL (ref 3.87–5.11)
RDW: 12.8 % (ref 11.5–15.5)
WBC: 6.3 10*3/uL (ref 4.0–10.5)

## 2023-02-27 LAB — HEMOGLOBIN A1C: Hgb A1c MFr Bld: 5.8 % (ref 4.6–6.5)

## 2023-02-28 ENCOUNTER — Other Ambulatory Visit: Payer: 59

## 2023-03-21 ENCOUNTER — Ambulatory Visit (INDEPENDENT_AMBULATORY_CARE_PROVIDER_SITE_OTHER): Payer: 59 | Admitting: Physician Assistant

## 2023-03-21 ENCOUNTER — Encounter: Payer: Self-pay | Admitting: Physician Assistant

## 2023-03-21 VITALS — BP 107/74 | HR 90 | Temp 98.1°F | Ht 63.0 in | Wt 188.1 lb

## 2023-03-21 DIAGNOSIS — R197 Diarrhea, unspecified: Secondary | ICD-10-CM | POA: Diagnosis not present

## 2023-03-21 NOTE — Progress Notes (Signed)
Established patient visit   Patient: Shelly Leblanc   DOB: 01/22/1996   27 y.o. Female  MRN: [id number] Visit Date: 03/21/2023  Today's healthcare provider: Alfredia Ferguson, PA-C   Chief Complaint  Patient presents with   GI Problem    Loss of appetite, low energy, loose stool- present for a week.  Low energy. She did change her diet about a week ago and doesn't know if that can cause it. Watching cals.   Subjective     Discussed the use of AI scribe software for clinical note transcription with the patient, who gave verbal consent to proceed.  History of Present Illness   The patient presents with a one-week history of diarrhea, decreased appetite, and general malaise. They report loose, watery stools occurring three to four times daily, with no blood noted. Accompanying symptoms include nausea, particularly at night, and a constant feeling of tiredness. The patient denies any changes in medication but notes a recent change in diet, reducing their caloric intake, which now has reduced further d/t lack of hunger. She also reports nasal congestion and a cough. They have taken over-the-counter immodium for their stomach, but are unsure if it has made any difference.        Medications: Outpatient Medications Prior to Visit  Medication Sig   spironolactone (ALDACTONE) 50 MG tablet Take 50 mg by mouth daily.   No facility-administered medications prior to visit.    Review of Systems  Constitutional:  Negative for fatigue and fever.  Respiratory:  Negative for cough and shortness of breath.   Cardiovascular:  Negative for chest pain and leg swelling.  Gastrointestinal:  Negative for abdominal pain.  Neurological:  Negative for dizziness and headaches.       Objective    BP 107/74   Pulse 90   Temp 98.1 F (36.7 C) (Oral)   Ht 5\' 3"  (1.6 m)   Wt 188 lb 2 oz (85.3 kg)   LMP 03/07/2023 (Approximate)   SpO2 98%   BMI 33.32 kg/m    Physical Exam Constitutional:       General: She is awake.     Appearance: She is well-developed.  HENT:     Head: Normocephalic.  Eyes:     Conjunctiva/sclera: Conjunctivae normal.  Cardiovascular:     Rate and Rhythm: Normal rate and regular rhythm.     Heart sounds: Normal heart sounds.  Pulmonary:     Effort: Pulmonary effort is normal.     Breath sounds: Normal breath sounds.  Abdominal:     Palpations: Abdomen is soft.     Tenderness: There is abdominal tenderness in the left upper quadrant. There is no guarding.  Skin:    General: Skin is warm.  Neurological:     Mental Status: She is alert and oriented to person, place, and time.  Psychiatric:        Attention and Perception: Attention normal.        Mood and Affect: Mood normal.        Speech: Speech normal.        Behavior: Behavior is cooperative.      No results found for any visits on 03/21/23.  Assessment & Plan    Diarrhea, unspecified type   Recommending hydration, otc pepcid, pepto bismol If symptoms persist through the weekend, advised her to call/message and would rx azithromycin to tx as infectious.  Return if symptoms worsen or fail to improve.  Alfredia Ferguson, PA-C  [facility] at [facility] [phone] (phone) [phone] (fax)  [facility]

## 2023-05-30 ENCOUNTER — Other Ambulatory Visit (HOSPITAL_COMMUNITY): Payer: Self-pay | Admitting: Student

## 2023-05-30 DIAGNOSIS — G96198 Other disorders of meninges, not elsewhere classified: Secondary | ICD-10-CM

## 2023-06-10 ENCOUNTER — Ambulatory Visit (HOSPITAL_COMMUNITY)
Admission: RE | Admit: 2023-06-10 | Discharge: 2023-06-10 | Disposition: A | Discharge status: Home / Self Care | Source: Ambulatory Visit | Attending: Student | Admitting: Student

## 2023-06-10 DIAGNOSIS — G96198 Other disorders of meninges, not elsewhere classified: Secondary | ICD-10-CM | POA: Insufficient documentation

## 2023-07-19 ENCOUNTER — Telehealth
